# Patient Record
Sex: Female | Born: 1935 | Race: White | Hispanic: No | Smoking: Never smoker
Health system: Southern US, Community
[De-identification: ages and names within clinical notes are randomized; demographics above are authoritative.]

## PROBLEM LIST (undated history)

## (undated) DIAGNOSIS — F419 Anxiety disorder, unspecified: Secondary | ICD-10-CM

## (undated) DIAGNOSIS — N189 Chronic kidney disease, unspecified: Secondary | ICD-10-CM

## (undated) DIAGNOSIS — I4891 Unspecified atrial fibrillation: Secondary | ICD-10-CM

## (undated) DIAGNOSIS — F32A Depression, unspecified: Secondary | ICD-10-CM

## (undated) DIAGNOSIS — M797 Fibromyalgia: Secondary | ICD-10-CM

## (undated) DIAGNOSIS — G959 Disease of spinal cord, unspecified: Secondary | ICD-10-CM

## (undated) DIAGNOSIS — G43909 Migraine, unspecified, not intractable, without status migrainosus: Secondary | ICD-10-CM

## (undated) DIAGNOSIS — I82409 Acute embolism and thrombosis of unspecified deep veins of unspecified lower extremity: Secondary | ICD-10-CM

## (undated) DIAGNOSIS — F329 Major depressive disorder, single episode, unspecified: Secondary | ICD-10-CM

## (undated) HISTORY — DX: Anxiety disorder, unspecified: F41.9

## (undated) HISTORY — DX: Depression, unspecified: F32.A

## (undated) HISTORY — DX: Unspecified atrial fibrillation: I48.91

## (undated) HISTORY — DX: Fibromyalgia: M79.7

## (undated) HISTORY — DX: Disease of spinal cord, unspecified: G95.9

## (undated) HISTORY — DX: Acute embolism and thrombosis of unspecified deep veins of unspecified lower extremity: I82.409

## (undated) HISTORY — PX: ESOPHAGUS SURGERY: SHX626

## (undated) HISTORY — DX: Chronic kidney disease, unspecified: N18.9

## (undated) HISTORY — DX: Migraine, unspecified, not intractable, without status migrainosus: G43.909

---
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
## 2018-05-03 ENCOUNTER — Encounter (HOSPITAL_BASED_OUTPATIENT_CLINIC_OR_DEPARTMENT_OTHER): Payer: Medicare Other | Attending: Internal Medicine

## 2018-05-03 DIAGNOSIS — L97212 Non-pressure chronic ulcer of right calf with fat layer exposed: Secondary | ICD-10-CM | POA: Diagnosis not present

## 2018-05-03 DIAGNOSIS — L97822 Non-pressure chronic ulcer of other part of left lower leg with fat layer exposed: Secondary | ICD-10-CM | POA: Insufficient documentation

## 2018-05-03 DIAGNOSIS — I87333 Chronic venous hypertension (idiopathic) with ulcer and inflammation of bilateral lower extremity: Secondary | ICD-10-CM | POA: Diagnosis not present

## 2018-05-03 DIAGNOSIS — Z8673 Personal history of transient ischemic attack (TIA), and cerebral infarction without residual deficits: Secondary | ICD-10-CM | POA: Insufficient documentation

## 2018-05-03 DIAGNOSIS — L97812 Non-pressure chronic ulcer of other part of right lower leg with fat layer exposed: Secondary | ICD-10-CM | POA: Diagnosis not present

## 2018-05-03 DIAGNOSIS — L97222 Non-pressure chronic ulcer of left calf with fat layer exposed: Secondary | ICD-10-CM | POA: Diagnosis not present

## 2018-05-10 DIAGNOSIS — I87333 Chronic venous hypertension (idiopathic) with ulcer and inflammation of bilateral lower extremity: Secondary | ICD-10-CM | POA: Diagnosis not present

## 2018-05-18 DIAGNOSIS — I87333 Chronic venous hypertension (idiopathic) with ulcer and inflammation of bilateral lower extremity: Secondary | ICD-10-CM | POA: Diagnosis not present

## 2018-05-24 ENCOUNTER — Encounter (HOSPITAL_BASED_OUTPATIENT_CLINIC_OR_DEPARTMENT_OTHER): Payer: Medicare Other | Attending: Internal Medicine

## 2018-05-24 DIAGNOSIS — G629 Polyneuropathy, unspecified: Secondary | ICD-10-CM | POA: Diagnosis not present

## 2018-05-24 DIAGNOSIS — I87333 Chronic venous hypertension (idiopathic) with ulcer and inflammation of bilateral lower extremity: Secondary | ICD-10-CM | POA: Insufficient documentation

## 2018-05-24 DIAGNOSIS — L97211 Non-pressure chronic ulcer of right calf limited to breakdown of skin: Secondary | ICD-10-CM | POA: Insufficient documentation

## 2018-05-24 DIAGNOSIS — L97221 Non-pressure chronic ulcer of left calf limited to breakdown of skin: Secondary | ICD-10-CM | POA: Diagnosis not present

## 2018-05-25 ENCOUNTER — Ambulatory Visit: Payer: MEDICARE | Admitting: Podiatry

## 2018-05-31 ENCOUNTER — Encounter: Payer: Self-pay | Admitting: Podiatry

## 2018-05-31 ENCOUNTER — Ambulatory Visit (INDEPENDENT_AMBULATORY_CARE_PROVIDER_SITE_OTHER): Payer: Medicare Other | Admitting: Podiatry

## 2018-05-31 ENCOUNTER — Other Ambulatory Visit (HOSPITAL_COMMUNITY): Payer: Self-pay | Admitting: Internal Medicine

## 2018-05-31 DIAGNOSIS — M79675 Pain in left toe(s): Secondary | ICD-10-CM | POA: Diagnosis not present

## 2018-05-31 DIAGNOSIS — Z7901 Long term (current) use of anticoagulants: Secondary | ICD-10-CM

## 2018-05-31 DIAGNOSIS — L600 Ingrowing nail: Secondary | ICD-10-CM | POA: Diagnosis not present

## 2018-05-31 DIAGNOSIS — L97929 Non-pressure chronic ulcer of unspecified part of left lower leg with unspecified severity: Principal | ICD-10-CM

## 2018-05-31 DIAGNOSIS — L84 Corns and callosities: Secondary | ICD-10-CM | POA: Diagnosis not present

## 2018-05-31 DIAGNOSIS — B351 Tinea unguium: Secondary | ICD-10-CM

## 2018-05-31 DIAGNOSIS — L97919 Non-pressure chronic ulcer of unspecified part of right lower leg with unspecified severity: Secondary | ICD-10-CM

## 2018-05-31 DIAGNOSIS — I87333 Chronic venous hypertension (idiopathic) with ulcer and inflammation of bilateral lower extremity: Principal | ICD-10-CM

## 2018-05-31 DIAGNOSIS — M79674 Pain in right toe(s): Secondary | ICD-10-CM

## 2018-05-31 NOTE — Progress Notes (Signed)
Subjective:    Patient ID: Jordan Nicholson, female    DOB: [birth year], 85 y.o.   MRN: [id number]  HPI 85 year old female presents the office today for concerns of toenail pain.  She states that she gets ingrowing to both of her big toes but she denies any drainage or redness.  She has a is sore and tender at times.  She also has calluses on her left foot that should have trimmed.  She states that she currently goes to wound care once a week and she gets home care.  She has been in the boots previously put on.  She is on Eliquis   Review of Systems  All other systems reviewed and are negative.  History reviewed. No pertinent past medical history.  History reviewed. No pertinent surgical history.   Current Outpatient Medications:  .  ALPRAZolam (XANAX) 0.25 MG tablet, TK 1 T PO D PRN, Disp: , Rfl:  .  apixaban (ELIQUIS) 5 MG TABS tablet, TAKE 2 TABLETS TWICE A DAY FOR 7 DAYS THEN 1 TABLET TWICE DAILY THEREAFTER - REPLACES CLOPIDOGREL, Disp: , Rfl:  .  sucralfate (CARAFATE) 1 GM/10ML suspension, TAKE 2 TEASPOONSFULL BY MOUTH TWICE DAILY FOR ESPHEAGEAL SPASM, Disp: , Rfl:  .  topiramate (TOPAMAX) 25 MG tablet, TAKE 6 TABLETS BY MOUTH AT BEDTIME AS NEEDED FOR HEADACHE PREVENTION, Disp: , Rfl:  .  cephALEXin (KEFLEX) 500 MG capsule, TAKE 1 CAPSULE BY MOUTH FOUR TIMES A DAY FOR 7 DAYS, Disp: , Rfl: 0 .  DEXILANT 60 MG capsule, Take 1 capsule by mouth 2 (two) times daily., Disp: , Rfl: 3 .  dicyclomine (BENTYL) 20 MG tablet, Take by mouth., Disp: , Rfl:  .  estrogens, conjugated, (PREMARIN) 0.625 MG tablet, Take by mouth., Disp: , Rfl:  .  famotidine (PEPCID) 20 MG tablet, TAKE 1 TABLET BY MOUTH TWICE A DAY AS DIRECTED, Disp: , Rfl: 5 .  isometheptene-acetaminophen-dichloralphenazone (MIDRIN) 65-100-325 MG capsule, Take by mouth., Disp: , Rfl:  .  mometasone (ELOCON) 0.1 % cream, APPLY TO AFFECTED AREA TWICE A DAY FOR 14 DAYS, Disp: , Rfl: 0  Allergies  Allergen Reactions  . Codeine Nausea Only   . Lactose Diarrhea         Objective:   Physical Exam General: NAD- presents with son  Dermatological: Nails appear to be hypertrophic and dystrophic with yellow-brown discoloration and there is incurvation present on both corners of the right and left hallux.  There is no redness or drainage or any swelling to the toenail sites.  No open lesions are identified but there is callus formation on the left second and fourth.  Upon debridement there is no underlying ulceration drainage or any signs of infection.  Vascular: She has bandages to both feet still able to evaluate pulses but she has immediate capillary refill time to all the digits.  Neruologic: Sensation appears to be mildly decreased at the toes.  Musculoskeletal: No gross boney pedal deformities bilateral. No pain, crepitus, or limitation noted with foot and ankle range of motion bilateral. Muscular strength 5/5 in all groups tested bilateral.       Assessment & Plan:  85 year old female with symptomatic onychomycosis, ingrown toenails, callus formation. -Treatment options discussed including all alternatives, risks, and complications -Etiology of symptoms were discussed -Nails debrided 10 without complications or bleeding.  I debrided the ingrowing portion of ingrown toenails with any complications or bleeding. -Total lesions are debrided x2 without any complications or bleeding. -Daily foot inspection -Follow-up in  3 months or sooner if any problems arise. In the meantime, encouraged to call the office with any questions, concerns, change in symptoms.   Ovid Curd, DPM

## 2018-06-01 DIAGNOSIS — I87333 Chronic venous hypertension (idiopathic) with ulcer and inflammation of bilateral lower extremity: Secondary | ICD-10-CM | POA: Diagnosis not present

## 2018-06-02 ENCOUNTER — Ambulatory Visit (HOSPITAL_COMMUNITY)
Admission: RE | Admit: 2018-06-02 | Discharge: 2018-06-02 | Disposition: A | Discharge status: Home / Self Care | Payer: Medicare Other | Source: Ambulatory Visit | Attending: Vascular Surgery | Admitting: Vascular Surgery

## 2018-06-02 DIAGNOSIS — I87333 Chronic venous hypertension (idiopathic) with ulcer and inflammation of bilateral lower extremity: Secondary | ICD-10-CM

## 2018-06-02 DIAGNOSIS — L97919 Non-pressure chronic ulcer of unspecified part of right lower leg with unspecified severity: Secondary | ICD-10-CM

## 2018-06-02 DIAGNOSIS — L97929 Non-pressure chronic ulcer of unspecified part of left lower leg with unspecified severity: Secondary | ICD-10-CM | POA: Diagnosis present

## 2018-06-07 DIAGNOSIS — I87333 Chronic venous hypertension (idiopathic) with ulcer and inflammation of bilateral lower extremity: Secondary | ICD-10-CM | POA: Diagnosis not present

## 2018-06-14 DIAGNOSIS — I87333 Chronic venous hypertension (idiopathic) with ulcer and inflammation of bilateral lower extremity: Secondary | ICD-10-CM | POA: Diagnosis not present

## 2018-06-21 ENCOUNTER — Encounter (HOSPITAL_BASED_OUTPATIENT_CLINIC_OR_DEPARTMENT_OTHER): Payer: Medicare Other | Attending: Internal Medicine

## 2018-06-21 DIAGNOSIS — I890 Lymphedema, not elsewhere classified: Secondary | ICD-10-CM | POA: Insufficient documentation

## 2018-06-21 DIAGNOSIS — I87333 Chronic venous hypertension (idiopathic) with ulcer and inflammation of bilateral lower extremity: Secondary | ICD-10-CM | POA: Insufficient documentation

## 2018-06-21 DIAGNOSIS — L97212 Non-pressure chronic ulcer of right calf with fat layer exposed: Secondary | ICD-10-CM | POA: Insufficient documentation

## 2018-06-21 DIAGNOSIS — L97222 Non-pressure chronic ulcer of left calf with fat layer exposed: Secondary | ICD-10-CM | POA: Insufficient documentation

## 2018-06-25 ENCOUNTER — Encounter: Payer: 59 | Admitting: Vascular Surgery

## 2018-06-28 DIAGNOSIS — I87333 Chronic venous hypertension (idiopathic) with ulcer and inflammation of bilateral lower extremity: Secondary | ICD-10-CM | POA: Diagnosis not present

## 2018-07-05 DIAGNOSIS — I87333 Chronic venous hypertension (idiopathic) with ulcer and inflammation of bilateral lower extremity: Secondary | ICD-10-CM | POA: Diagnosis not present

## 2018-07-12 DIAGNOSIS — I87333 Chronic venous hypertension (idiopathic) with ulcer and inflammation of bilateral lower extremity: Secondary | ICD-10-CM | POA: Diagnosis not present

## 2018-07-19 ENCOUNTER — Encounter (HOSPITAL_BASED_OUTPATIENT_CLINIC_OR_DEPARTMENT_OTHER): Payer: Medicare Other | Attending: Internal Medicine

## 2018-07-19 DIAGNOSIS — I87332 Chronic venous hypertension (idiopathic) with ulcer and inflammation of left lower extremity: Secondary | ICD-10-CM | POA: Diagnosis present

## 2018-07-19 DIAGNOSIS — L97922 Non-pressure chronic ulcer of unspecified part of left lower leg with fat layer exposed: Secondary | ICD-10-CM | POA: Insufficient documentation

## 2018-07-19 DIAGNOSIS — G629 Polyneuropathy, unspecified: Secondary | ICD-10-CM | POA: Diagnosis not present

## 2018-07-27 DIAGNOSIS — I87332 Chronic venous hypertension (idiopathic) with ulcer and inflammation of left lower extremity: Secondary | ICD-10-CM | POA: Diagnosis not present

## 2018-08-02 ENCOUNTER — Other Ambulatory Visit: Payer: Self-pay

## 2018-08-02 ENCOUNTER — Encounter: Payer: Self-pay | Admitting: Podiatry

## 2018-08-02 ENCOUNTER — Ambulatory Visit (INDEPENDENT_AMBULATORY_CARE_PROVIDER_SITE_OTHER): Payer: Medicare Other | Admitting: Podiatry

## 2018-08-02 ENCOUNTER — Encounter: Payer: 59 | Admitting: Surgery

## 2018-08-02 DIAGNOSIS — Z9229 Personal history of other drug therapy: Secondary | ICD-10-CM

## 2018-08-02 DIAGNOSIS — L84 Corns and callosities: Secondary | ICD-10-CM

## 2018-08-02 DIAGNOSIS — B351 Tinea unguium: Secondary | ICD-10-CM | POA: Diagnosis not present

## 2018-08-02 DIAGNOSIS — M79675 Pain in left toe(s): Secondary | ICD-10-CM

## 2018-08-02 DIAGNOSIS — M79674 Pain in right toe(s): Secondary | ICD-10-CM

## 2018-08-02 NOTE — Patient Instructions (Signed)
Corns and Calluses Corns are small areas of thickened skin that occur on the top, sides, or tip of a toe. They contain a cone-shaped core with a point that can press on a nerve below. This causes pain.  Calluses are areas of thickened skin that can occur anywhere on the body, including the hands, fingers, palms, soles of the feet, and heels. Calluses are usually larger than corns. What are the causes? Corns and calluses are caused by rubbing (friction) or pressure, such as from shoes that are too tight or do not fit properly. What increases the risk? Corns are more likely to develop in people who have misshapen toes (toe deformities), such as hammer toes. Calluses can occur with friction to any area of the skin. They are more likely to develop in people who:  Work with their hands.  Wear shoes that fit poorly, are too tight, or are high-heeled.  Have toe deformities. What are the signs or symptoms? Symptoms of a corn or callus include:  A hard growth on the skin.  Pain or tenderness under the skin.  Redness and swelling.  Increased discomfort while wearing tight-fitting shoes, if your feet are affected. If a corn or callus becomes infected, symptoms may include:  Redness and swelling that gets worse.  Pain.  Fluid, blood, or pus draining from the corn or callus. How is this diagnosed? Corns and calluses may be diagnosed based on your symptoms, your medical history, and a physical exam. How is this treated? Treatment for corns and calluses may include:  Removing the cause of the friction or pressure. This may involve: ? Changing your shoes. ? Wearing shoe inserts (orthotics) or other protective layers in your shoes, such as a corn pad. ? Wearing gloves.  Applying medicine to the skin (topical medicine) to help soften skin in the hardened, thickened areas.  Removing layers of dead skin with a file to reduce the size of the corn or callus.  Removing the corn or callus with a  scalpel or laser.  Taking antibiotic medicines, if your corn or callus is infected.  Having surgery, if a toe deformity is the cause. Follow these instructions at home:   Take over-the-counter and prescription medicines only as told by your health care provider.  If you were prescribed an antibiotic, take it as told by your health care provider. Do not stop taking it even if your condition starts to improve.  Wear shoes that fit well. Avoid wearing high-heeled shoes and shoes that are too tight or too loose.  Wear any padding, protective layers, gloves, or orthotics as told by your health care provider.  Soak your hands or feet and then use a file or pumice stone to soften your corn or callus. Do this as told by your health care provider.  Check your corn or callus every day for symptoms of infection. Contact a health care provider if you:  Notice that your symptoms do not improve with treatment.  Have redness or swelling that gets worse.  Notice that your corn or callus becomes painful.  Have fluid, blood, or pus coming from your corn or callus.  Have new symptoms. Summary  Corns are small areas of thickened skin that occur on the top, sides, or tip of a toe.  Calluses are areas of thickened skin that can occur anywhere on the body, including the hands, fingers, palms, and soles of the feet. Calluses are usually larger than corns.  Corns and calluses are caused by   rubbing (friction) or pressure, such as from shoes that are too tight or do not fit properly.  Treatment may include wearing any padding, protective layers, gloves, or orthotics as told by your health care provider. This information is not intended to replace advice given to you by your health care provider. Make sure you discuss any questions you have with your health care provider. Document Released: 02/09/2004 Document Revised: 03/18/2017 Document Reviewed: 03/18/2017 Elsevier Interactive Patient Education   2019 [employer]  Onychomycosis/Fungal Toenails  WHAT IS IT? An infection that lies within the keratin of your nail plate that is caused by a fungus.  WHY ME? Fungal infections affect all ages, sexes, races, and creeds.  There may be many factors that predispose you to a fungal infection such as age, coexisting medical conditions such as diabetes, or an autoimmune disease; stress, medications, fatigue, genetics, etc.  Bottom line: fungus thrives in a warm, moist environment and your shoes offer such a location.  IS IT CONTAGIOUS? Theoretically, yes.  You do not want to share shoes, nail clippers or files with someone who has fungal toenails.  Walking around barefoot in the same room or sleeping in the same bed is unlikely to transfer the organism.  It is important to realize, however, that fungus can spread easily from one nail to the next on the same foot.  HOW DO WE TREAT THIS?  There are several ways to treat this condition.  Treatment may depend on many factors such as age, medications, pregnancy, liver and kidney conditions, etc.  It is best to ask your doctor which options are available to you.  1. No treatment.   Unlike many other medical concerns, you can live with this condition.  However for many people this can be a painful condition and may lead to ingrown toenails or a bacterial infection.  It is recommended that you keep the nails cut short to help reduce the amount of fungal nail. 2. Topical treatment.  These range from herbal remedies to prescription strength nail lacquers.  About 40-50% effective, topicals require twice daily application for approximately 9 to 12 months or until an entirely new nail has grown out.  The most effective topicals are medical grade medications available through physicians offices. 3. Oral antifungal medications.  With an 80-90% cure rate, the most common oral medication requires 3 to 4 months of therapy and stays in your system for a year as the new nail  grows out.  Oral antifungal medications do require blood work to make sure it is a safe drug for you.  A liver function panel will be performed prior to starting the medication and after the first month of treatment.  It is important to have the blood work performed to avoid any harmful side effects.  In general, this medication safe but blood work is required. 4. Laser Therapy.  This treatment is performed by applying a specialized laser to the affected nail plate.  This therapy is noninvasive, fast, and non-painful.  It is not covered by insurance and is therefore, out of pocket.  The results have been very good with a 80-95% cure rate.  [facility] is the only practice in the area to offer this therapy. 5. Permanent Nail Avulsion.  Removing the entire nail so that a new nail will not grow back. 

## 2018-08-03 DIAGNOSIS — I87332 Chronic venous hypertension (idiopathic) with ulcer and inflammation of left lower extremity: Secondary | ICD-10-CM | POA: Diagnosis not present

## 2018-08-09 ENCOUNTER — Encounter: Payer: 59 | Admitting: Surgery

## 2018-08-11 NOTE — Progress Notes (Signed)
Subjective: Jordan Nicholson is a 85 y.o. y.o. female who is on long term blood thinner Eliquis and presents today with painful, discolored, thick toenails which interfere with daily activities. Pain is aggravated when wearing enclosed shoe gear. Pain is relieved with periodic professional debridement.  Pt also presents with painful corn left 2nd digit.  Patient, No Pcp Per    Current Outpatient Medications:  .  escitalopram (LEXAPRO) 5 MG tablet, Take one tab PO qhs., Disp: , Rfl:  .  hydrochlorothiazide (HYDRODIURIL) 25 MG tablet, TAKE 1 TABLET(25 MG) BY MOUTH DAILY, Disp: , Rfl:  .  lidocaine (LIDODERM) 5 %, APPLY 1 PATCH D, Disp: , Rfl:  .  ALPRAZolam (XANAX) 0.25 MG tablet, TK 1 T PO D PRN, Disp: , Rfl:  .  apixaban (ELIQUIS) 5 MG TABS tablet, TAKE 2 TABLETS TWICE A DAY FOR 7 DAYS THEN 1 TABLET TWICE DAILY THEREAFTER - REPLACES CLOPIDOGREL, Disp: , Rfl:  .  cephALEXin (KEFLEX) 500 MG capsule, TAKE 1 CAPSULE BY MOUTH FOUR TIMES A DAY FOR 7 DAYS, Disp: , Rfl: 0 .  DEXILANT 60 MG capsule, Take 1 capsule by mouth 2 (two) times daily., Disp: , Rfl: 3 .  dicyclomine (BENTYL) 20 MG tablet, Take by mouth., Disp: , Rfl:  .  estrogens, conjugated, (PREMARIN) 0.625 MG tablet, Take by mouth., Disp: , Rfl:  .  famotidine (PEPCID) 20 MG tablet, TAKE 1 TABLET BY MOUTH TWICE A DAY AS DIRECTED, Disp: , Rfl: 5 .  isometheptene-acetaminophen-dichloralphenazone (MIDRIN) 65-100-325 MG capsule, Take by mouth., Disp: , Rfl:  .  mometasone (ELOCON) 0.1 % cream, APPLY TO AFFECTED AREA TWICE A DAY FOR 14 DAYS, Disp: , Rfl: 0 .  ranitidine (ZANTAC) 150 MG tablet, TAKE 1 TABLET BY MOUTH TWICE A DAY ONE HOUR BEFORE MEALS, Disp: , Rfl: 0 .  sucralfate (CARAFATE) 1 GM/10ML suspension, TAKE 2 TEASPOONSFULL BY MOUTH TWICE DAILY FOR ESPHEAGEAL SPASM, Disp: , Rfl:  .  topiramate (TOPAMAX) 25 MG tablet, TAKE 6 TABLETS BY MOUTH AT BEDTIME AS NEEDED FOR HEADACHE PREVENTION, Disp: , Rfl:  .  traMADol (ULTRAM) 50 MG tablet, Take by  mouth., Disp: , Rfl:    Allergies  Allergen Reactions  . Codeine Nausea Only  . Lactose Diarrhea     Objective: Vascular Examination: Capillary refill time immediate x 10 digits.  Dorsalis pedis pulses palpable b/l.  Posterior tibial pulses palpable b/l.  No digital hair x 10 digits.  Skin temperature gradient WNL b/l.  Dermatological Examination: Skin warm and dry b/l.  Toenails 1-5 b/l discolored, thick, dystrophic with subungual debris and pain with palpation to nailbeds due to thickness of nails.  Hyperkeratotic lesion left 2nd digit. No erythema, no edema, no drainage, no flocculence noted.   Porokeratotic lesions submet with tenderness to palpation. No erythema, no edema, no drainage, no flocculence.   Musculoskeletal: Muscle strength 5/5 to all LE muscle groups  Neurological: Sensation decreased with 10 gram monofilament.  Assessment: Painful onychomycosis toenails 1-5 b/l in patient on blood thinner.  Corn left 2nd digit  Plan: 1. Toenails 1-5 b/l were debrided in length and girth without iatrogenic bleeding. 2. Hyperkeratotic lesion(s) left 2nd digit debrided utilizing sterile scalpel blade without incident.  3. Patient to continue soft, supportive shoe gear daily. 4. Patient to report any pedal injuries to medical professional immediately. 5. Avoid self trimming due to use of blood thinner. 6. Follow up 3 months. 7. Patient/POA to call should there be a concern in the interim.

## 2018-08-19 ENCOUNTER — Other Ambulatory Visit: Payer: Self-pay

## 2018-08-19 ENCOUNTER — Encounter (HOSPITAL_BASED_OUTPATIENT_CLINIC_OR_DEPARTMENT_OTHER): Payer: Medicare Other | Attending: Internal Medicine

## 2018-08-19 DIAGNOSIS — L97222 Non-pressure chronic ulcer of left calf with fat layer exposed: Secondary | ICD-10-CM | POA: Diagnosis not present

## 2018-08-19 DIAGNOSIS — L03115 Cellulitis of right lower limb: Secondary | ICD-10-CM | POA: Insufficient documentation

## 2018-08-19 DIAGNOSIS — I87332 Chronic venous hypertension (idiopathic) with ulcer and inflammation of left lower extremity: Secondary | ICD-10-CM | POA: Diagnosis present

## 2018-08-19 DIAGNOSIS — L03116 Cellulitis of left lower limb: Secondary | ICD-10-CM | POA: Diagnosis not present

## 2018-09-02 DIAGNOSIS — I87332 Chronic venous hypertension (idiopathic) with ulcer and inflammation of left lower extremity: Secondary | ICD-10-CM | POA: Diagnosis not present

## 2018-09-06 DIAGNOSIS — I87332 Chronic venous hypertension (idiopathic) with ulcer and inflammation of left lower extremity: Secondary | ICD-10-CM | POA: Diagnosis not present

## 2018-10-04 ENCOUNTER — Ambulatory Visit: Payer: Medicare Other | Admitting: Podiatry

## 2018-10-18 ENCOUNTER — Encounter: Payer: Self-pay | Admitting: Neurology

## 2018-10-18 ENCOUNTER — Ambulatory Visit (INDEPENDENT_AMBULATORY_CARE_PROVIDER_SITE_OTHER): Payer: Medicare Other | Admitting: Neurology

## 2018-10-18 ENCOUNTER — Telehealth: Payer: Self-pay | Admitting: Neurology

## 2018-10-18 ENCOUNTER — Other Ambulatory Visit: Payer: Self-pay

## 2018-10-18 VITALS — BP 118/70 | HR 58 | Temp 98.1°F | Ht 61.0 in | Wt 99.0 lb

## 2018-10-18 DIAGNOSIS — E538 Deficiency of other specified B group vitamins: Secondary | ICD-10-CM

## 2018-10-18 DIAGNOSIS — R413 Other amnesia: Secondary | ICD-10-CM | POA: Diagnosis not present

## 2018-10-18 DIAGNOSIS — R269 Unspecified abnormalities of gait and mobility: Secondary | ICD-10-CM

## 2018-10-18 MED ORDER — MEMANTINE HCL 5 MG PO TABS: ORAL_TABLET | ORAL | 0 refills | Status: AC

## 2018-10-18 NOTE — Progress Notes (Signed)
Reason for visit: Memory disturbance, cervical myelopathy  Referring physician: Dr. Jinny Blossom is a 85 y.o. female  History of present illness:  Jordan Nicholson is an 85 year old right-handed white female with a history of cervical spondylosis associated with a cervical myelopathy.  The patient has recently moved from [place], [place] to this area to live with her daughter.  The patient had been living alone there for about 10 years after her husband died.  She had been followed through a neurologist in [place], [place].  She had MRI of the cervical spine showing severe spinal stenosis at the C2-3 level with increased cord signal consistent with a myelopathy.  The patient has had some ongoing progression of her problems with walking, she has not had any recent falls, she walks with a cane.  She has noted some recent increased problems with urinary urgency and incontinence.  She has been seen by 2 neurosurgeons who indicated that surgery was not recommended.  The patient has also had some troubles with memory, it is not clear how long this has been an issue.  The patient has a history of fibromyalgia and irritable bowel syndrome and migraine headache.  She has been on Topamax for quite a number of years, she currently takes 150 mg at night.  She takes Bentyl occasionally if needed for her irritable bowel.  She reports that she has some trouble with remembering names for people, she may misplace things about the house.  Prior to her move to this area, she was able to manage her medications and appointments, she operated a motor vehicle without difficulty, she was able to do the finances.  She currently indicates that she does have some neck and shoulder discomfort, she may have some tingles or shock sensations down the arms at times.  She reports some occasional episodes of dizziness.  Her migraine headaches occur frequently, almost daily but are quite brief lasting only a few minutes  or up to a half an hour.  The Topamax did help these headaches.  She has had these headaches throughout her entire life.  She indicates that her father had memory problems when he was 32 years old.  Past Medical History:  Diagnosis Date   Anxiety    Atrial fibrillation (HCC)    Cervical myelopathy (HCC)    Chronic renal insufficiency    Depression    DVT (deep venous thrombosis) (HCC)    Left lower extremity   Fibromyalgia    Migraine    Myelopathy (HCC)     Past Surgical History:  Procedure Laterality Date   ESOPHAGUS SURGERY      Family History  Problem Relation Age of Onset   Non-Hodgkin's lymphoma Mother    Cancer - Lung Sister    Heart disease Sister     Social history:  reports that she has never smoked. She has never used smokeless tobacco. She reports that she does not drink alcohol or use drugs.  Medications:  Prior to Admission medications   Medication Sig Start Date End Date Taking? Authorizing Provider  Alpha Lipoic Acid 200 MG CAPS Take by mouth.   Yes [provider]  ALPRAZolam (XANAX) 0.25 MG tablet TK 1 T PO D PRN 10/26/14  Yes [provider]  apixaban (ELIQUIS) 5 MG TABS tablet TAKE 2 TABLETS TWICE A DAY FOR 7 DAYS THEN 1 TABLET TWICE DAILY THEREAFTER - REPLACES CLOPIDOGREL 11/27/14  Yes [provider]  Cholecalciferol (VITAMIN D3 PO)  Take by mouth. 2000 units   Yes [provider]  co-enzyme Q-10 30 MG capsule Take 30 mg by mouth 3 (three) times daily.   Yes [provider]  Cyanocobalamin (VITAMIN B 12 PO) Take by mouth.   Yes [provider]  DEXILANT 60 MG capsule Take 1 capsule by mouth 2 (two) times daily. 03/08/18  Yes [provider]  dicyclomine (BENTYL) 20 MG tablet Take by mouth.   Yes [provider]  Docosahexaenoic Acid (DHA COMPLETE PO) Take by mouth.   Yes [provider]  estrogens, conjugated, (PREMARIN) 0.625 MG tablet Take by mouth.   Yes  [provider]  isometheptene-acetaminophen-dichloralphenazone (MIDRIN) 65-100-325 MG capsule Take by mouth.   Yes [provider]  Probiotic Product (PROBIOTIC-10 PO) Take by mouth.   Yes [provider]  sucralfate (CARAFATE) 1 GM/10ML suspension TAKE 2 TEASPOONSFULL BY MOUTH TWICE DAILY FOR ESPHEAGEAL SPASM 10/30/14  Yes [provider]  topiramate (TOPAMAX) 25 MG tablet TAKE 6 TABLETS BY MOUTH AT BEDTIME AS NEEDED FOR HEADACHE PREVENTION 03/17/16  Yes [provider]  cephALEXin (KEFLEX) 500 MG capsule TAKE 1 CAPSULE BY MOUTH FOUR TIMES A DAY FOR 7 DAYS 03/16/18   [provider]  escitalopram (LEXAPRO) 5 MG tablet Take one tab PO qhs. 10/23/15   [provider]  famotidine (PEPCID) 20 MG tablet TAKE 1 TABLET BY MOUTH TWICE A DAY AS DIRECTED 03/24/18   [provider]  hydrochlorothiazide (HYDRODIURIL) 25 MG tablet TAKE 1 TABLET(25 MG) BY MOUTH DAILY 02/16/17   [provider]  lidocaine (LIDODERM) 5 % APPLY 1 PATCH D 01/11/15   [provider]  mometasone (ELOCON) 0.1 % cream APPLY TO AFFECTED AREA TWICE A DAY FOR 14 DAYS 04/06/18   [provider]  ranitidine (ZANTAC) 150 MG tablet TAKE 1 TABLET BY MOUTH TWICE A DAY ONE HOUR BEFORE MEALS 02/15/18   [provider]  traMADol (ULTRAM) 50 MG tablet Take by mouth.    [provider]      Allergies  Allergen Reactions   Codeine Nausea Only   Lactose Diarrhea    ROS:  Out of a complete 14 system review of symptoms, the patient complains only of the following symptoms, and all other reviewed systems are negative.  Fatigue Heart murmur, swelling in the legs Hearing loss, difficulty swallowing Skin rash, itching Double vision Constipation Easy bruising Feeling cold, increased thirst Joint pain, muscle cramps Allergies, runny nose, skin sensitivity Headaches, numbness, difficulty swallowing, dizziness Depression, anxiety,  decreased energy, change in appetite Insomnia, sleepiness  Blood pressure 118/70, pulse (!) 58, temperature 98.1 F (36.7 C), temperature source Oral, height  (1.549 m), weight 99 lb (44.9 kg).  Physical Exam  General: The patient is alert and cooperative at the time of the examination.  Eyes: Pupils are equal, round, and reactive to light. Discs are flat bilaterally.  Neck: The neck is supple, no carotid bruits are noted.  Respiratory: The respiratory examination is clear.  Cardiovascular: The cardiovascular examination reveals an irregularly irregular heart rhythm, no obvious murmurs or rubs are noted.  Skin: Extremities are with 1-2+ edema below the knees.  Neurologic Exam  Mental status: The patient is alert and oriented x 3 at the time of the examination. The Mini-Mental Status Examination done today shows total score 23/30.  Cranial nerves: Facial symmetry is present. There is good sensation of the face to pinprick and soft touch bilaterally. The strength of the facial muscles  and the muscles to head turning and shoulder shrug are normal bilaterally. Speech is well enunciated, no aphasia or dysarthria is noted. Extraocular movements are full. Visual fields are full. The tongue is midline, and the patient has symmetric elevation of the soft palate. No obvious hearing deficits are noted.  Motor: The motor testing reveals 5 over 5 strength of all 4 extremities. Good symmetric motor tone is noted throughout.  Sensory: Sensory testing is intact to pinprick, soft touch, vibration sensation, and position sense on all 4 extremities, with exception of some slight decrease in position sense in both feet. No evidence of extinction is noted.  Coordination: Cerebellar testing reveals good finger-nose-finger and heel-to-shin bilaterally.  Gait and station: Gait is slightly wide-based but the patient can walk independently, she usually uses a cane for ambulation.  Tandem gait is slightly  unsteady.  Romberg is negative.  Reflexes: Deep tendon reflexes are symmetric, but are slightly brisk bilaterally. Toes are downgoing bilaterally.   Assessment/Plan:  1.  Cervical myelopathy  2.  Gait disorder  3.  Intractable migraine headache  4.  Memory disturbance  5.  Fibromyalgia  6.  Cerebrovascular disease  7.  Atrial fibrillation  The patient is on a relatively high dose of Topamax which can impair cognitive functioning but lower doses apparently in the past have resulted in an increase in her headache.  The patient may be candidate for 1 of the newer medications such as Aimovig or Ajovy in the future and possibly allow us to reduce the Topamax dose.  The patient has been on gabapentin in the past but could not tolerate it, so believes that she has been on Cymbalta previously.  The patient will be set up for CT scan of the brain, the memory issue will be followed, she will start Namenda.  The patient will be sent for physical therapy for gait training although her balance is not extremely bad.  The cervical myelopathy issue is not treatable given her age.  She will follow-up here in 4 months.    Marlan Palau. Keith Eilene Voigt MD 10/18/2018 2:29 PM  [facility] [address] [place] [id number]  Phone [phone] Fax [phone]

## 2018-10-18 NOTE — Patient Instructions (Signed)
We will start Namenda for the memory. Call for a regular prescription in one month if tolerated.

## 2018-10-18 NOTE — Telephone Encounter (Signed)
Medicare/CIgna order sent to GI. No auth for Medicare they obtain the auth for Rosann Auerbach they will reach out to the patient to schedule.

## 2018-10-19 ENCOUNTER — Telehealth: Payer: Self-pay

## 2018-10-19 LAB — VITAMIN B12: Vitamin B-12: 690 pg/mL (ref 232–1245)

## 2018-10-19 LAB — SYPHILIS: RPR W/REFLEX TO RPR TITER AND TREPONEMAL ANTIBODIES, TRADITIONAL SCREENING AND DIAGNOSIS ALGORITHM: RPR Ser Ql: NONREACTIVE

## 2018-10-19 NOTE — Telephone Encounter (Signed)
-----   Message from York Spaniel, MD sent at 10/19/2018 10:29 AM EDT -----  The blood work results are unremarkable. Please call the patient. ----- Message ----- From: [facility] Results In Sent: 10/19/2018   7:37 AM EDT To: York Spaniel, MD

## 2018-10-19 NOTE — Telephone Encounter (Signed)
I contacted the pt and advised of blood test results. Pt verbalized understanding and had no questions at this time.

## 2018-10-20 ENCOUNTER — Ambulatory Visit (INDEPENDENT_AMBULATORY_CARE_PROVIDER_SITE_OTHER): Payer: Medicare Other | Admitting: Podiatry

## 2018-10-20 ENCOUNTER — Encounter: Payer: Self-pay | Admitting: Podiatry

## 2018-10-20 ENCOUNTER — Other Ambulatory Visit: Payer: Self-pay

## 2018-10-20 VITALS — Temp 97.7°F

## 2018-10-20 DIAGNOSIS — L84 Corns and callosities: Secondary | ICD-10-CM | POA: Diagnosis not present

## 2018-10-20 DIAGNOSIS — G629 Polyneuropathy, unspecified: Secondary | ICD-10-CM | POA: Diagnosis not present

## 2018-10-20 DIAGNOSIS — M79674 Pain in right toe(s): Secondary | ICD-10-CM

## 2018-10-20 DIAGNOSIS — B351 Tinea unguium: Secondary | ICD-10-CM | POA: Diagnosis not present

## 2018-10-20 DIAGNOSIS — M79675 Pain in left toe(s): Secondary | ICD-10-CM

## 2018-10-20 DIAGNOSIS — Z9229 Personal history of other drug therapy: Secondary | ICD-10-CM

## 2018-10-20 NOTE — Patient Instructions (Signed)

## 2018-10-24 NOTE — Progress Notes (Signed)
Subjective: Jordan Nicholson is a 85 y.o. y.o. female who is on long term blood thinner, Eliquis,  and presents today with painful, discolored, thick toenails which interfere with daily activities. Pain is aggravated when wearing enclosed shoe gear. Pain is relieved with periodic professional debridement.  Pt also presents with painful corn formation left 2nd digit. Pain is aggravated when wearing enclosed shoe gear as well.  Irena Reichmannollins, Dana, DO  Is her PCP.    Current Outpatient Medications:  .  Alpha Lipoic Acid 200 MG CAPS, Take by mouth., Disp: , Rfl:  .  ALPRAZolam (XANAX) 0.25 MG tablet, TK 1 T PO D PRN, Disp: , Rfl:  .  apixaban (ELIQUIS) 5 MG TABS tablet, TAKE 2 TABLETS TWICE A DAY FOR 7 DAYS THEN 1 TABLET TWICE DAILY THEREAFTER - REPLACES CLOPIDOGREL, Disp: , Rfl:  .  cephALEXin (KEFLEX) 500 MG capsule, TAKE 1 CAPSULE BY MOUTH FOUR TIMES A DAY FOR 7 DAYS, Disp: , Rfl: 0 .  Cholecalciferol (VITAMIN D3 PO), Take by mouth. 2000 units, Disp: , Rfl:  .  co-enzyme Q-10 30 MG capsule, Take 30 mg by mouth 3 (three) times daily., Disp: , Rfl:  .  Cyanocobalamin (VITAMIN B 12 PO), Take by mouth., Disp: , Rfl:  .  DEXILANT 60 MG capsule, Take 1 capsule by mouth 2 (two) times daily., Disp: , Rfl: 3 .  dicyclomine (BENTYL) 20 MG tablet, Take by mouth., Disp: , Rfl:  .  Docosahexaenoic Acid (DHA COMPLETE PO), Take by mouth., Disp: , Rfl:  .  escitalopram (LEXAPRO) 5 MG tablet, Take one tab PO qhs., Disp: , Rfl:  .  estrogens, conjugated, (PREMARIN) 0.625 MG tablet, Take by mouth., Disp: , Rfl:  .  famotidine (PEPCID) 20 MG tablet, TAKE 1 TABLET BY MOUTH TWICE A DAY AS DIRECTED, Disp: , Rfl: 5 .  hydrochlorothiazide (HYDRODIURIL) 25 MG tablet, TAKE 1 TABLET(25 MG) BY MOUTH DAILY, Disp: , Rfl:  .  isometheptene-acetaminophen-dichloralphenazone (MIDRIN) 65-100-325 MG capsule, Take by mouth., Disp: , Rfl:  .  lidocaine (LIDODERM) 5 %, APPLY 1 PATCH D, Disp: , Rfl:  .  memantine (NAMENDA) 5 MG tablet, Take 1  tablet daily for one week, then take 1 tablet twice daily for one week, then take 1 tablet in the morning and 2 in the evening for one week, then take 2 tablets twice daily, Disp: 70 tablet, Rfl: 0 .  mometasone (ELOCON) 0.1 % cream, APPLY TO AFFECTED AREA TWICE A DAY FOR 14 DAYS, Disp: , Rfl: 0 .  Probiotic Product (PROBIOTIC-10 PO), Take by mouth., Disp: , Rfl:  .  ranitidine (ZANTAC) 150 MG tablet, TAKE 1 TABLET BY MOUTH TWICE A DAY ONE HOUR BEFORE MEALS, Disp: , Rfl: 0 .  sucralfate (CARAFATE) 1 GM/10ML suspension, TAKE 2 TEASPOONSFULL BY MOUTH TWICE DAILY FOR ESPHEAGEAL SPASM, Disp: , Rfl:  .  topiramate (TOPAMAX) 25 MG tablet, TAKE 6 TABLETS BY MOUTH AT BEDTIME AS NEEDED FOR HEADACHE PREVENTION, Disp: , Rfl:  .  traMADol (ULTRAM) 50 MG tablet, Take by mouth., Disp: , Rfl:    Allergies  Allergen Reactions  . Codeine Nausea Only  . Lactose Diarrhea     Objective: Vitals:   10/20/18 1617  Temp: 97.7 F (36.5 C)    Vascular Examination: Capillary refill time immediate x 10 digits.  Dorsalis pedis pulses palpable b/l.  Posterior tibial pulses palpable b/l.  No digital hair x 10 digits.  Skin temperature gradient WNL b/l.   Dermatological Examination: Skin with  normal turgor, texture and tone b/l.  Toenails 1-5 b/l discolored, thick, dystrophic with subungual debris and pain with palpation to nailbeds due to thickness of nails.  Hyperkeratotic lesion dorsal right 2nd digit PIPJ. No erythema, no edema, no drainage, no flocculence noted.  Musculoskeletal: Muscle strength 5/5 to all LE muscle groups.  Hammertoe left 2nd digit.  Neurological: Sensation decreased with 10 gram monofilament.  Vibratory sensation intact.  Assessment: 1. Painful onychomycosis toenails 1-5 b/l in patient on blood thinner 2. Corn left 2nd digit 3. Neuropathy  Plan: 1. Toenails 1-5 b/l were debrided in length and girth without iatrogenic bleeding. 2. Hyperkeratotic lesion(s) left 2nd digit  debrided utilizing sterile scalpel blade without incident. Dispensed silicone digital toe cap for daily protection and comfort.  3. Patient to continue soft, supportive shoe gear daily. 4. Patient to report any pedal injuries to medical professional immediately. 5. Avoid self trimming due to use of blood thinner. 6. Follow up 10 weeks. 7. Patient/POA to call should there be a concern in the interim.

## 2018-11-03 ENCOUNTER — Ambulatory Visit
Admission: RE | Admit: 2018-11-03 | Discharge: 2018-11-03 | Disposition: A | Discharge status: Home / Self Care | Payer: Medicare Other | Source: Ambulatory Visit | Attending: Neurology | Admitting: Neurology

## 2018-11-03 DIAGNOSIS — R413 Other amnesia: Secondary | ICD-10-CM | POA: Diagnosis not present

## 2018-11-04 ENCOUNTER — Telehealth: Payer: Self-pay | Admitting: Neurology

## 2018-11-04 NOTE — Telephone Encounter (Signed)
I called pt. She reports dizziness since starting namenda. She is concerned about her fall risk with dizziness. She stopped taking namenda a week ago. Since she stopped namenda, her dizziness has improved. She is not interested in starting a new medication for her memory at this time.  This is just an FYI to Dr. Jannifer Franklin, per pt.

## 2018-11-04 NOTE — Telephone Encounter (Signed)
Pt states she is not tolerating her memantine (NAMENDA) 5 MG tablet very well and she would like RN or Provider to call her back when back in office.

## 2018-11-05 ENCOUNTER — Telehealth: Payer: Self-pay | Admitting: Neurology

## 2018-11-05 NOTE — Telephone Encounter (Signed)
I called the patient.  CT scan of the brain appears to be unremarkable, there is some mild atrophy seen.  No significant cerebrovascular disease is noted.  I discussed this with the patient.    CT brain 11/03/18:  IMPRESSION:   CT head (without) demonstrating: - Mild perisylvian and mesial temporal atrophy.  - No acute findings.

## 2018-11-07 ENCOUNTER — Other Ambulatory Visit: Payer: Self-pay | Admitting: Neurology

## 2018-11-08 ENCOUNTER — Encounter: Payer: Self-pay | Admitting: Surgery

## 2018-11-08 ENCOUNTER — Ambulatory Visit (INDEPENDENT_AMBULATORY_CARE_PROVIDER_SITE_OTHER): Payer: Medicare Other | Admitting: Surgery

## 2018-11-08 DIAGNOSIS — I872 Venous insufficiency (chronic) (peripheral): Secondary | ICD-10-CM | POA: Diagnosis not present

## 2018-11-08 DIAGNOSIS — M7989 Other specified soft tissue disorders: Secondary | ICD-10-CM | POA: Insufficient documentation

## 2018-11-08 NOTE — Telephone Encounter (Signed)
I called the daughter.  The patient cannot tolerate Namenda secondary to dizziness.  She does not wish to go on a medication for memory currently.  We may try slowly tapering the Topamax, she is on 150 mg at night, we will try reducing this by 25 mg every week, if the headaches significantly worsen, we may have to see if we can get her on another medication such as Aimovig.  The CT of the brain was relatively unremarkable.

## 2018-11-08 NOTE — Progress Notes (Signed)
[facility]  Patient name: Jordan Nicholson MRN: [id number] DOB: 06-06-1926 Sex: female   REQUESTING PROVIDER:    Dr. Leanord Hawkingobson   REASON FOR CONSULT:    Venous disease  HISTORY OF PRESENT ILLNESS:   Jordan AlarJayne Bondarenko is a 85 y.o. female, who is referred for further evaluation of leg swelling.  The patient has had trouble with leg swelling for years.  She has had multiple ulcers, fortunately which have all healed.  Her first ulcer took 2 years to heal the second took 1 year to heal.  She has also had issues with infection.  She can not put on compression stickings due to her arthritis.  She tries to keep her legs elevated however this is difficult for her.  She does receive some benefit with regards to the swelling with leg elevation.  The patient is on Eliquis for atrial fibrillation.  She suffers from chronic renal insufficiency.  She also has a history of DVT.  PAST MEDICAL HISTORY    Past Medical History:  Diagnosis Date  . Anxiety   . Atrial fibrillation (HCC)   . Cervical myelopathy (HCC)   . Chronic renal insufficiency   . Depression   . DVT (deep venous thrombosis) (HCC)    Left lower extremity  . Fibromyalgia   . Migraine   . Myelopathy (HCC)      FAMILY HISTORY   Family History  Problem Relation Age of Onset  . Non-Hodgkin's lymphoma Mother   . Dementia Father   . Cancer - Lung Sister   . Heart disease Sister     SOCIAL HISTORY:   Social History   Socioeconomic History  . Marital status: Widowed    Spouse name: Not on file  . Number of children: Not on file  . Years of education: Not on file  . Highest education level: Not on file  Occupational History  . Not on file  Social Needs  . Financial resource strain: Not on file  . Food insecurity    Worry: Not on file    Inability: Not on file  . Transportation needs    Medical: Not on file    Non-medical: Not on file  Tobacco Use  . Smoking  status: Never Smoker  . Smokeless tobacco: Never Used  Substance and Sexual Activity  . Alcohol use: Never    Frequency: Never  . Drug use: Never  . Sexual activity: Not on file  Lifestyle  . Physical activity    Days per week: Not on file    Minutes per session: Not on file  . Stress: Not on file  Relationships  . Social Musicianconnections    Talks on phone: Not on file    Gets together: Not on file    Attends religious service: Not on file    Active member of club or organization: Not on file    Attends meetings of clubs or organizations: Not on file    Relationship status: Not on file  . Intimate partner violence    Fear of current or ex partner: Not on file    Emotionally abused: Not on file    Physically abused: Not on file    Forced sexual activity: Not on file  Other Topics Concern  . Not on file  Social History Narrative   Right handed    Caffeine 2 cups of tea daily   Lives with daughter     ALLERGIES:    Allergies  Allergen Reactions  . Codeine Nausea Only  . Lactose Diarrhea    CURRENT MEDICATIONS:    Current Outpatient Medications  Medication Sig Dispense Refill  . Alpha Lipoic Acid 200 MG CAPS Take by mouth.    . ALPRAZolam (XANAX) 0.25 MG tablet TK 1 T PO D PRN    . apixaban (ELIQUIS) 5 MG TABS tablet TAKE 2 TABLETS TWICE A DAY FOR 7 DAYS THEN 1 TABLET TWICE DAILY THEREAFTER - REPLACES CLOPIDOGREL    . cephALEXin (KEFLEX) 500 MG capsule TAKE 1 CAPSULE BY MOUTH FOUR TIMES A DAY FOR 7 DAYS  0  . Cholecalciferol (VITAMIN D3 PO) Take by mouth. 2000 units    . co-enzyme Q-10 30 MG capsule Take 30 mg by mouth 3 (three) times daily.    . Cyanocobalamin (VITAMIN B 12 PO) Take by mouth.    . DEXILANT 60 MG capsule Take 1 capsule by mouth 2 (two) times daily.  3  . dicyclomine (BENTYL) 20 MG tablet Take by mouth.    . Docosahexaenoic Acid (DHA COMPLETE PO) Take by mouth.    . escitalopram (LEXAPRO) 5 MG tablet Take one tab PO qhs.    . estrogens, conjugated,  (PREMARIN) 0.625 MG tablet Take by mouth.    . famotidine (PEPCID) 20 MG tablet TAKE 1 TABLET BY MOUTH TWICE A DAY AS DIRECTED  5  . hydrochlorothiazide (HYDRODIURIL) 25 MG tablet TAKE 1 TABLET(25 MG) BY MOUTH DAILY    . isometheptene-acetaminophen-dichloralphenazone (MIDRIN) 65-100-325 MG capsule Take by mouth.    . lidocaine (LIDODERM) 5 % APPLY 1 PATCH D    . memantine (NAMENDA) 5 MG tablet Take 1 tablet daily for one week, then take 1 tablet twice daily for one week, then take 1 tablet in the morning and 2 in the evening for one week, then take 2 tablets twice daily 70 tablet 0  . mometasone (ELOCON) 0.1 % cream APPLY TO AFFECTED AREA TWICE A DAY FOR 14 DAYS  0  . Probiotic Product (PROBIOTIC-10 PO) Take by mouth.    . ranitidine (ZANTAC) 150 MG tablet TAKE 1 TABLET BY MOUTH TWICE A DAY ONE HOUR BEFORE MEALS  0  . sucralfate (CARAFATE) 1 GM/10ML suspension TAKE 2 TEASPOONSFULL BY MOUTH TWICE DAILY FOR ESPHEAGEAL SPASM    . topiramate (TOPAMAX) 25 MG tablet TAKE 6 TABLETS BY MOUTH AT BEDTIME AS NEEDED FOR HEADACHE PREVENTION    . traMADol (ULTRAM) 50 MG tablet Take by mouth.     No current facility-administered medications for this visit.     REVIEW OF SYSTEMS:   [X]  denotes positive finding, [ ]  denotes negative finding Cardiac  Comments:  Chest pain or chest pressure:    Shortness of breath upon exertion:    Short of breath when lying flat:    Irregular heart rhythm: x       Vascular    Pain in calf, thigh, or hip brought on by ambulation: x   Pain in feet at night that wakes you up from your sleep:  x   Blood clot in your veins: x   Leg swelling:  x       Pulmonary    Oxygen at home:    Productive cough:     Wheezing:         Neurologic    Sudden weakness in arms or legs:     Sudden numbness in arms or legs:     Sudden onset of difficulty speaking or slurred speech:  Temporary loss of vision in one eye:     Problems with dizziness:  x       Gastrointestinal     Blood in stool:      Vomited blood:         Genitourinary    Burning when urinating:     Blood in urine:        Psychiatric    Major depression:         Hematologic    Bleeding problems:    Problems with blood clotting too easily:        Skin    Rashes or ulcers:        Constitutional    Fever or chills:     PHYSICAL EXAM:   Vitals:   11/08/18 1338  BP: (!) 142/69  Pulse: 77  Resp: 20  Temp: 97.8 F (36.6 C)  TempSrc: Oral  SpO2: 97%  Weight: 46.3 kg  Height: 5\' 1"  (1.549 m)    GENERAL: The patient is a well-nourished female, in no acute distress. The vital signs are documented above. CARDIAC: There is a regular rate and rhythm.  VASCULAR: Pedal pulses are nonpalpable.  1-2+ pitting edema bilaterally PULMONARY: Nonlabored respirations ABDOMEN: Soft and non-tender with normal pitched bowel sounds.  MUSCULOSKELETAL: There are no major deformities or cyanosis. NEUROLOGIC: No focal weakness or paresthesias are detected. SKIN: Skin discoloration with evidence of healed ulcers on both medial ankles PSYCHIATRIC: The patient has a normal affect.  STUDIES:   I have reviewed the following:    Venous Reflux Times Normal value < 0.5 sec  +------------------------------+----------+---------+                 Right (ms)Left (ms)  +------------------------------+----------+---------+  CFV                   2743.00   +------------------------------+----------+---------+  GSV at Saphenofemoral junction653.00  3044.00   +------------------------------+----------+---------+  GSV prox thigh             653.00    +------------------------------+----------+---------+  GSV mid thigh              675.00    +------------------------------+----------+---------+  GSV dist thigh        689.00  506.00    +------------------------------+----------+---------+  GSV at knee                521.00    +------------------------------+----------+---------+  GSV prox calf              521.00    +------------------------------+----------+---------+  GSV mid calf              528.00    +------------------------------+----------+---------+   Vein Diameters:  +------------------------------+--------------+--------------+                 Right (cm)  Left (cm)     +------------------------------+--------------+--------------+  GSV at Saphenofemoral junction0.684     0.513       +------------------------------+--------------+--------------+  GSV at prox thigh       0.435     0.462       +------------------------------+--------------+--------------+  GSV at mid thigh       0.497     0.358       +------------------------------+--------------+--------------+  GSV at distal thigh      0.381     0.373       +------------------------------+--------------+--------------+  GSV at knee          0.365     0.285       +------------------------------+--------------+--------------+  GSV prox calf         0.308     0.381       +------------------------------+--------------+--------------+  GSV mid calf         0.335     0.301       +------------------------------+--------------+--------------+  SSV origin          Not visualizedNot visualized  +------------------------------+--------------+--------------+  SSV prox           Not visualizedNot visualized  +------------------------------+--------------+--------------+  SSV mid            Not visualizedNot visualized  +------------------------------+--------------+--------------+   ASSESSMENT and PLAN   CEAP class V: The patient has had recurrent wounds in both legs.  I do not feel that based off  her venous reflux study that she would receive significant benefit from laser ablation.  I suspect she has venous insufficiency superimposed on chronic lymphedema tarda.  Unfortunately because of age and arthritis, she is having difficulty getting compression socks in place.  She finds it hard to keep her legs elevated as she is standing most of the day.  I feel she would be a great candidate for external pneumatic compression and will attempt to get this approved.  She has a follow-up with me in 6 months.   Charlena CrossWells , IV, MD, FACS [facility] Tel [phone] Pager [phone]

## 2018-11-08 NOTE — Telephone Encounter (Signed)
Pt daughter(on DPR) is asking for a call to discuss the results.  Daughter has questions

## 2018-11-08 NOTE — Telephone Encounter (Signed)
Noted  

## 2018-12-28 ENCOUNTER — Ambulatory Visit: Payer: Medicare Other | Admitting: Podiatry

## 2019-02-07 ENCOUNTER — Other Ambulatory Visit: Payer: Self-pay

## 2019-02-07 ENCOUNTER — Ambulatory Visit: Payer: Medicare Other | Admitting: Surgery

## 2019-02-07 ENCOUNTER — Ambulatory Visit: Payer: 59 | Admitting: Surgery

## 2019-03-07 ENCOUNTER — Ambulatory Visit: Payer: Medicare Other | Admitting: Neurology

## 2020-12-05 ENCOUNTER — Other Ambulatory Visit: Payer: Self-pay | Admitting: Family Medicine

## 2020-12-05 DIAGNOSIS — Z8673 Personal history of transient ischemic attack (TIA), and cerebral infarction without residual deficits: Secondary | ICD-10-CM

## 2020-12-05 DIAGNOSIS — H532 Diplopia: Secondary | ICD-10-CM

## 2020-12-07 ENCOUNTER — Other Ambulatory Visit: Payer: Self-pay | Admitting: Family Medicine

## 2020-12-07 DIAGNOSIS — N6320 Unspecified lump in the left breast, unspecified quadrant: Secondary | ICD-10-CM

## 2020-12-21 ENCOUNTER — Other Ambulatory Visit: Payer: Self-pay

## 2020-12-21 ENCOUNTER — Ambulatory Visit
Admission: RE | Admit: 2020-12-21 | Discharge: 2020-12-21 | Disposition: A | Discharge status: Home / Self Care | Payer: Medicare Other | Source: Ambulatory Visit | Attending: Family Medicine | Admitting: Family Medicine

## 2020-12-21 DIAGNOSIS — H532 Diplopia: Secondary | ICD-10-CM

## 2020-12-21 DIAGNOSIS — Z8673 Personal history of transient ischemic attack (TIA), and cerebral infarction without residual deficits: Secondary | ICD-10-CM

## 2020-12-21 IMAGING — MR MR HEAD WO/W CM
12 series · 48 of 48 positions shown · IV contrast (multihance)
Comparison: Head CT without contrast [DATE].

CLINICAL DATA: [AGE] female with 2 years of double vision.
History of stroke 7 years ago.

EXAM:
MRI HEAD WITHOUT AND WITH CONTRAST
TECHNIQUE: Multiplanar, multiecho pulse sequences of the brain and surrounding
structures were obtained without and with intravenous contrast.
CONTRAST:  10mL MULTIHANCE GADOBENATE DIMEGLUMINE 529 MG/ML IV SOLN

[Series 2: T1 · sagittal · 5.0mm · 0.45mm/px · 2 of 23 slices shown]
[im 1/23]
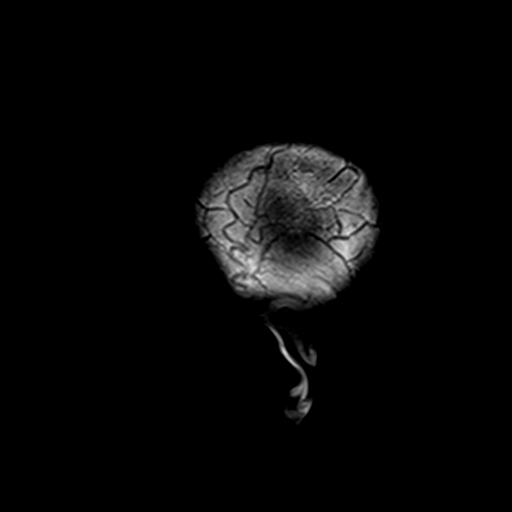
[im 23/23]
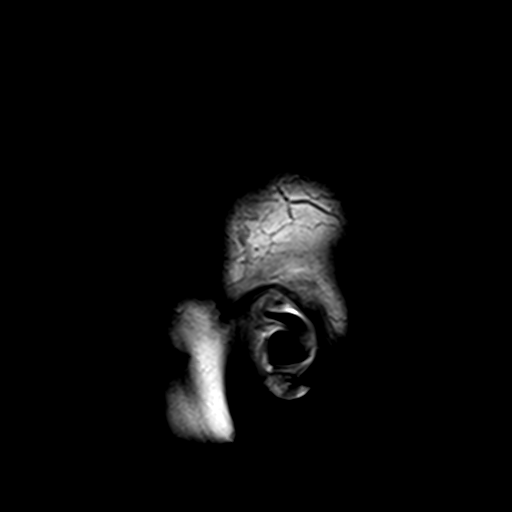

[Series 3: DWI · axial · 3.0mm · 1.80mm/px · z∈[-64,+82]mm · 7 of 96 slices shown (1 of 4)]
[im 1/96]
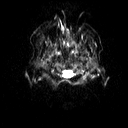
[im 16/96]
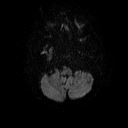
[im 32/96]
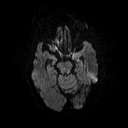
[im 48/96]
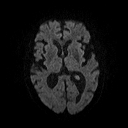
[im 64/96]
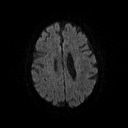
[im 80/96]
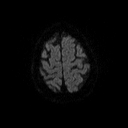
[im 96/96]
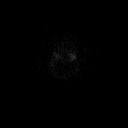

[Series 4: DWI · axial · 3.0mm · 1.80mm/px · z∈[-64,+82]mm · 3 of 49 slices shown (2 of 4)]
[im 1/49]
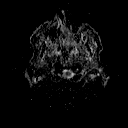
[im 25/49]
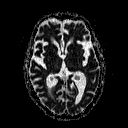
[im 49/49]
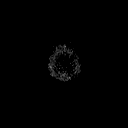

[Series 5: DWI · coronal · 5.0mm · 1.80mm/px · 5 of 70 slices shown (3 of 4)]
[im 1/70]
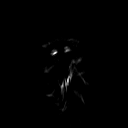
[im 18/70]
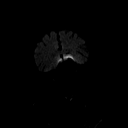
[im 35/70]
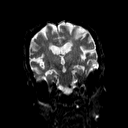
[im 52/70]
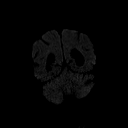
[im 70/70]
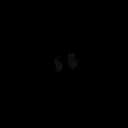

[Series 6: DWI · coronal · 5.0mm · 1.80mm/px · 2 of 35 slices shown (4 of 4)]
[im 1/35]
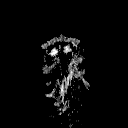
[im 35/35]
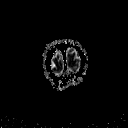

[Series 7: T2 · axial · 5.0mm · 0.60mm/px · 1 of 22 slices shown (1 of 2)]
[im 1/22]
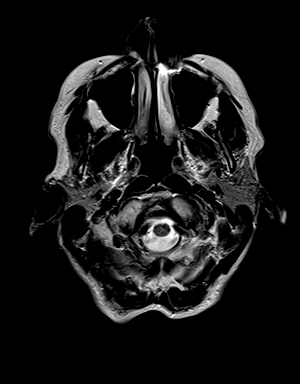

[Series 8: FLAIR · axial · 3.0mm · 0.45mm/px · z∈[-58,+76]mm · 2 of 30 slices shown]
[im 1/30]
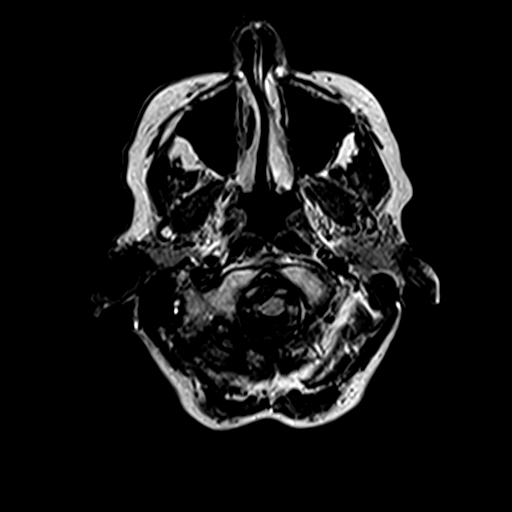
[im 30/30]
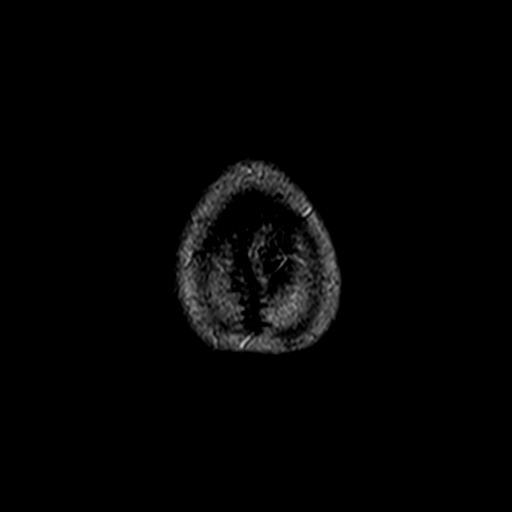

[Series 10: swi_images · axial · 4.0mm · 0.90mm/px · z∈[-60,+79]mm · 2 of 36 slices shown]
[im 1/36]
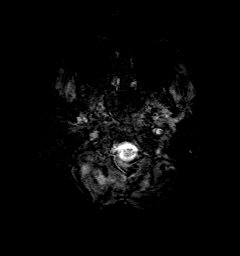
[im 36/36]
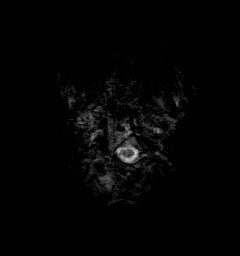

[Series 11: t1_mpr_tra · axial · 1.0mm · 0.75mm/px · z∈[-63,+82]mm · 10 of 144 slices shown]
[im 1/144]
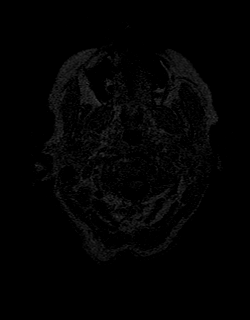
[im 16/144]
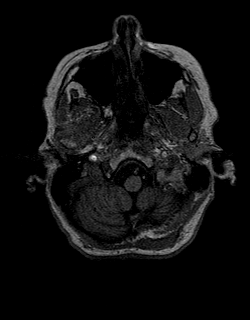
[im 32/144]
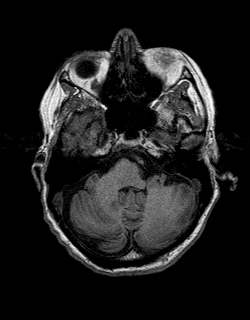
[im 48/144]
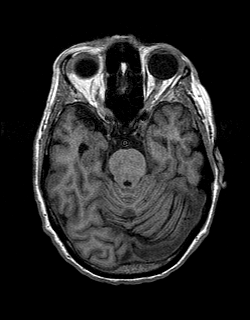
[im 64/144]
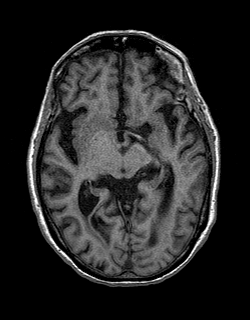
[im 80/144]
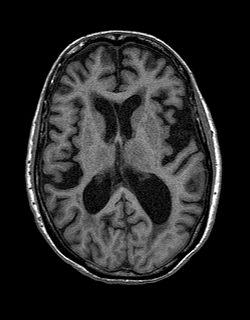
[im 96/144]
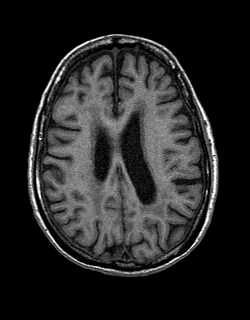
[im 112/144]
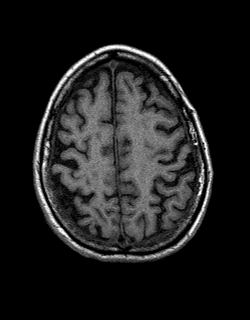
[im 128/144]
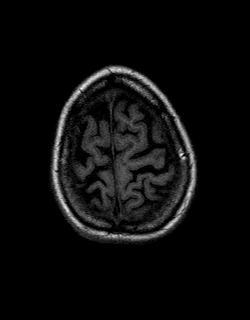
[im 144/144]
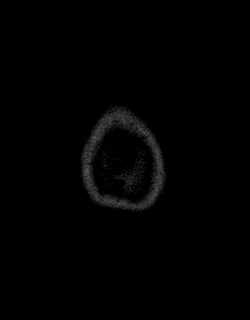

[Series 12: T2 · coronal · 5.0mm · 0.45mm/px · 2 of 28 slices shown (2 of 2)]
[im 1/28]
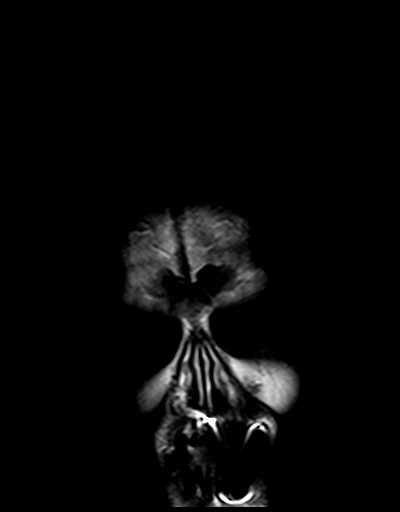
[im 28/28]
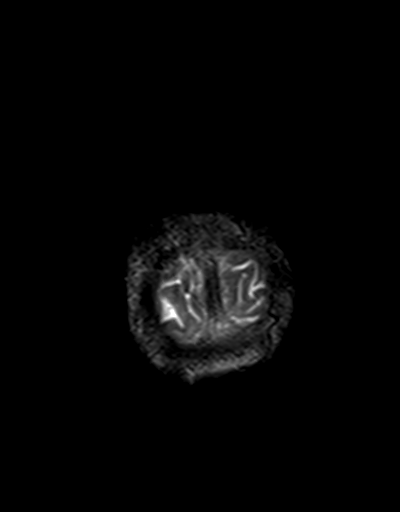

[Series 13: t1_mpr_tra post · axial · 1.0mm · 0.75mm/px · z∈[-63,+82]mm · 10 of 144 slices shown]
[im 1/144]
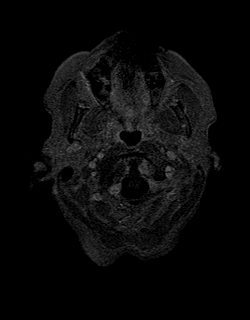
[im 16/144]
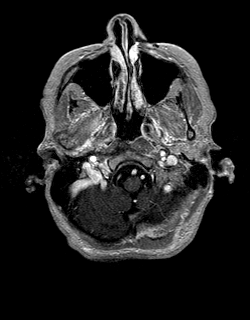
[im 32/144]
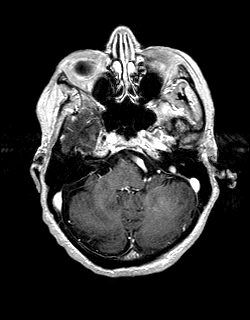
[im 48/144]
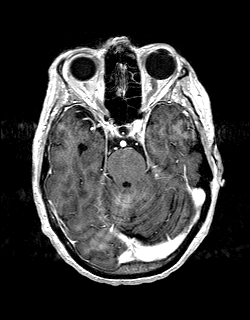
[im 64/144]
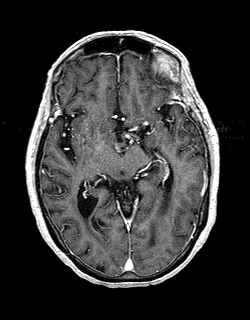
[im 80/144]
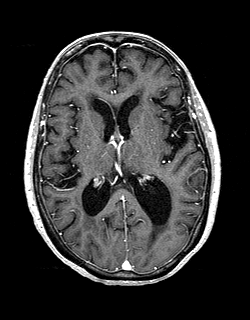
[im 96/144]
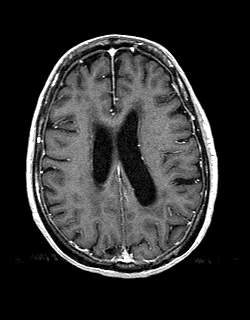
[im 112/144]
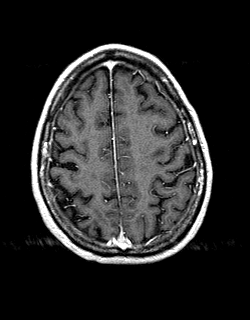
[im 128/144]
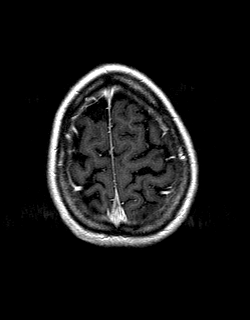
[im 144/144]
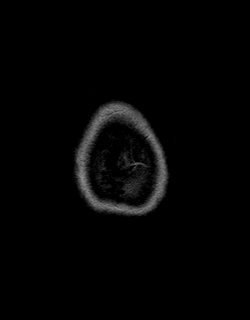

[Series 14: post cor · coronal · 5.0mm · 0.45mm/px · 2 of 28 slices shown]
[im 1/28]
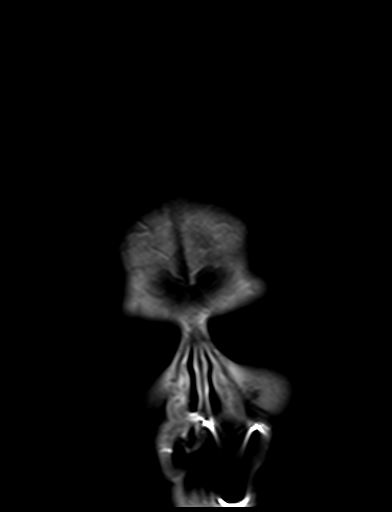
[im 28/28]
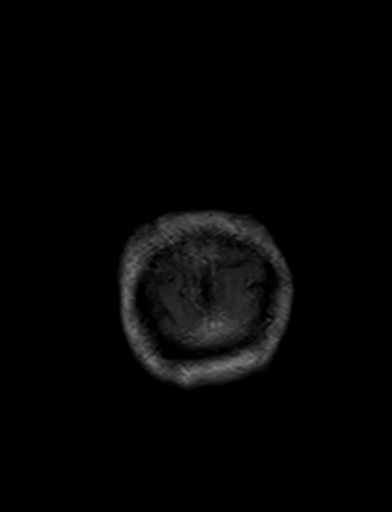

[48 of 48 positions shown; findings below may reference images not displayed]

FINDINGS: Brain: No convincing restricted diffusion to suggest acute
infarction.

There are multiple small chronic infarcts in the bilateral
cerebellum, more pronounced on the left. There is patchy asymmetric
T2 and FLAIR hyperintensity in the right pons (series 7, image 7).
There are multiple small chronic cortically based infarcts in the
superior MCA territories including pre motor cortex on the left and
postcentral cortex on the right (series 8, image 24), stable since
[VN]. Subtle occipital lobe chronic cortical infarcts more apparent
on the right (series 8, image 14). Patchy and confluent superimposed
bilateral cerebral white matter T2 and FLAIR hyperintensity.
Superimposed mild to moderate T2 heterogeneity in the bilateral deep
gray matter nuclei. No chronic cerebral blood products are evident
on SWI.

No abnormal enhancement identified. No dural thickening.

No midline shift, mass effect, evidence of mass lesion,
ventriculomegaly, extra-axial collection or acute intracranial
hemorrhage. Cervicomedullary junction and pituitary are within
normal limits.

Vascular: Major intracranial vascular flow voids are preserved.
There is generalized intracranial artery ectasia and tortuosity. The
major dural venous sinuses are enhancing and appear to be patent.

Skull and upper cervical spine: Partially visible cervical spine
degeneration and mild spinal stenosis. Visualized bone marrow signal
is within normal limits.

Sinuses/Orbits: Postoperative changes to both globes. Otherwise
negative orbits. Paranasal Visualized paranasal sinuses and mastoids
are stable and well aerated.

Other: Visible internal auditory structures appear normal. Negative
visible scalp and face.
IMPRESSION: 1. No acute intracranial abnormality.
2. Moderately advanced chronic ischemic disease, with no significant
progression evident from a [VN] Head CT.
3. Generalized intracranial artery ectasia, tortuosity.

## 2020-12-21 MED ORDER — GADOBENATE DIMEGLUMINE 529 MG/ML IV SOLN
10.0000 mL | Freq: Once | INTRAVENOUS | Status: AC | PRN
  Administered 2020-12-21: 10 mL via INTRAVENOUS

## 2021-05-01 ENCOUNTER — Ambulatory Visit
Admission: RE | Admit: 2021-05-01 | Discharge: 2021-05-01 | Disposition: A | Discharge status: Home / Self Care | Payer: Medicare Other | Source: Ambulatory Visit | Attending: Family Medicine | Admitting: Family Medicine

## 2021-05-01 DIAGNOSIS — N6320 Unspecified lump in the left breast, unspecified quadrant: Secondary | ICD-10-CM

## 2021-05-01 IMAGING — MG DIGITAL DIAGNOSTIC BILAT W/ TOMO W/ CAD
8 series · 9 of 24 positions shown · non-contrast
Comparison: Previous exam(s).

CLINICAL DATA: Patient reports left breast lumpiness and pain,
mostly along the lateral breast and towards the left axilla.

EXAM:
DIGITAL DIAGNOSTIC BILATERAL MAMMOGRAM WITH TOMOSYNTHESIS AND CAD;
ULTRASOUND LEFT BREAST LIMITED
TECHNIQUE: Bilateral digital diagnostic mammography and breast tomosynthesis
was performed. The images were evaluated with computer-aided
detection.; Targeted ultrasound examination of the left breast was
performed.

[L MLO synth-2D]
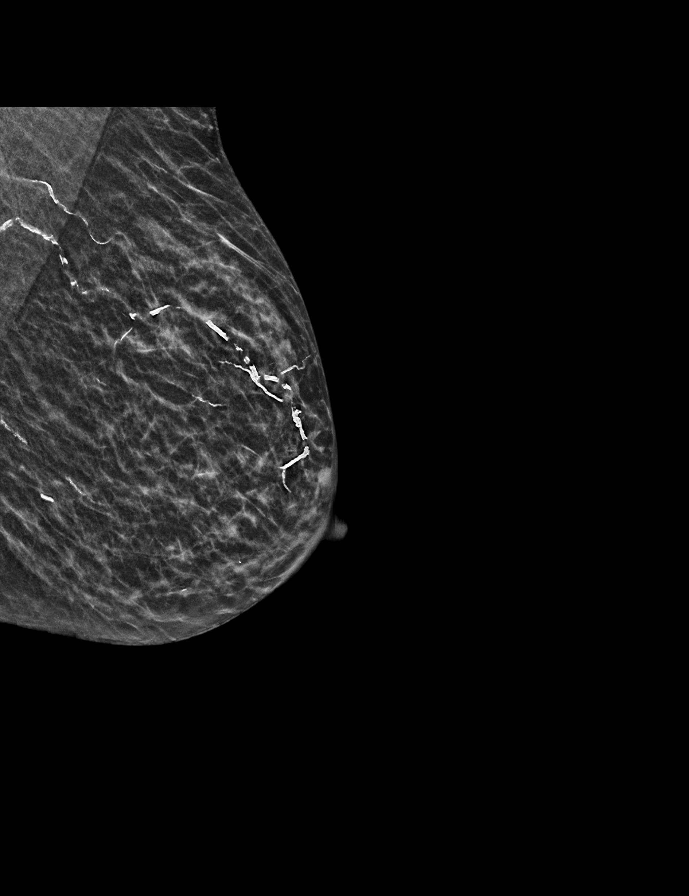

[R CC synth-2D]
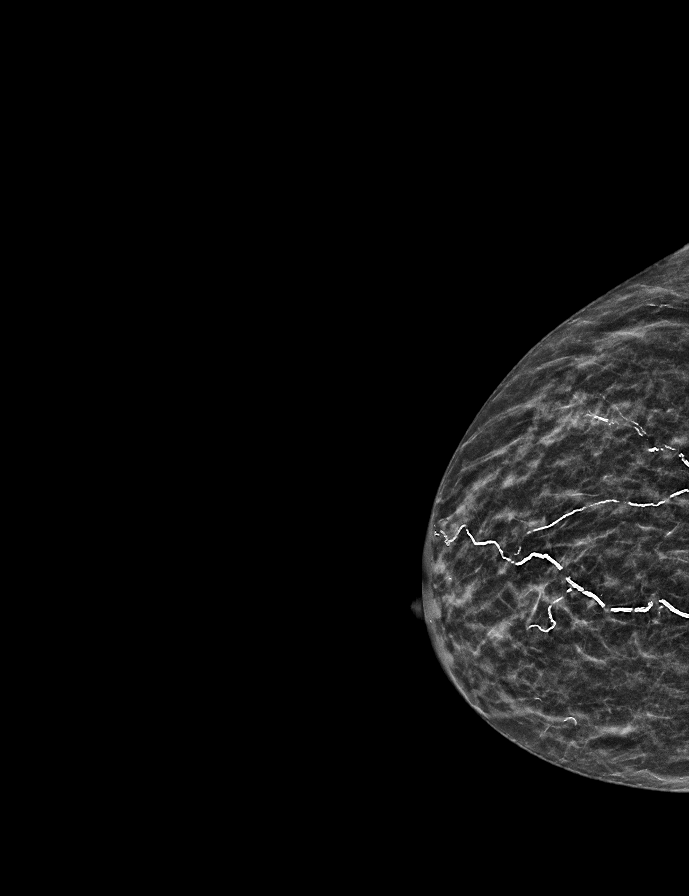

[L CC synth-2D]
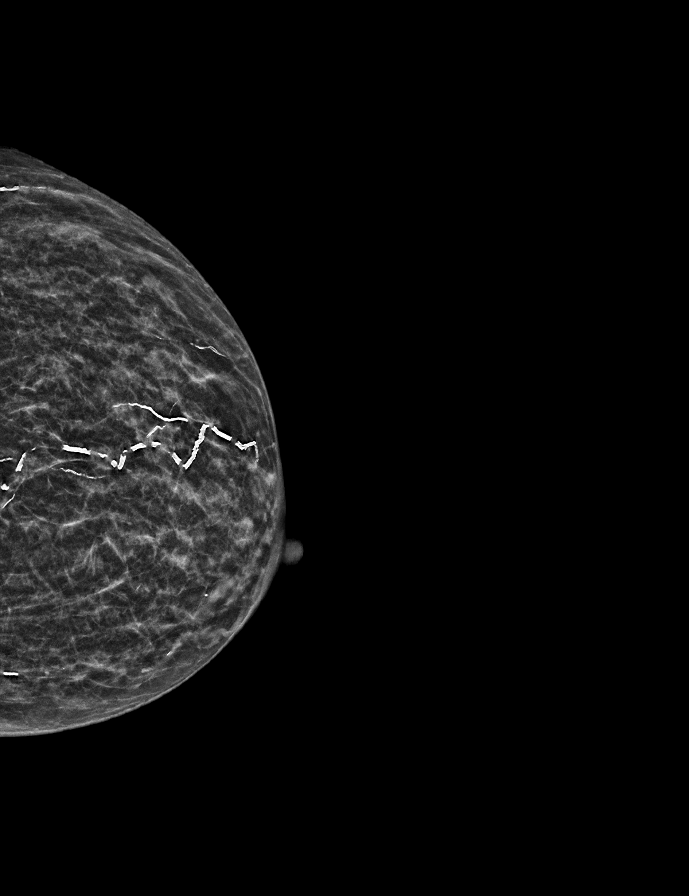

[R MLO synth-2D]
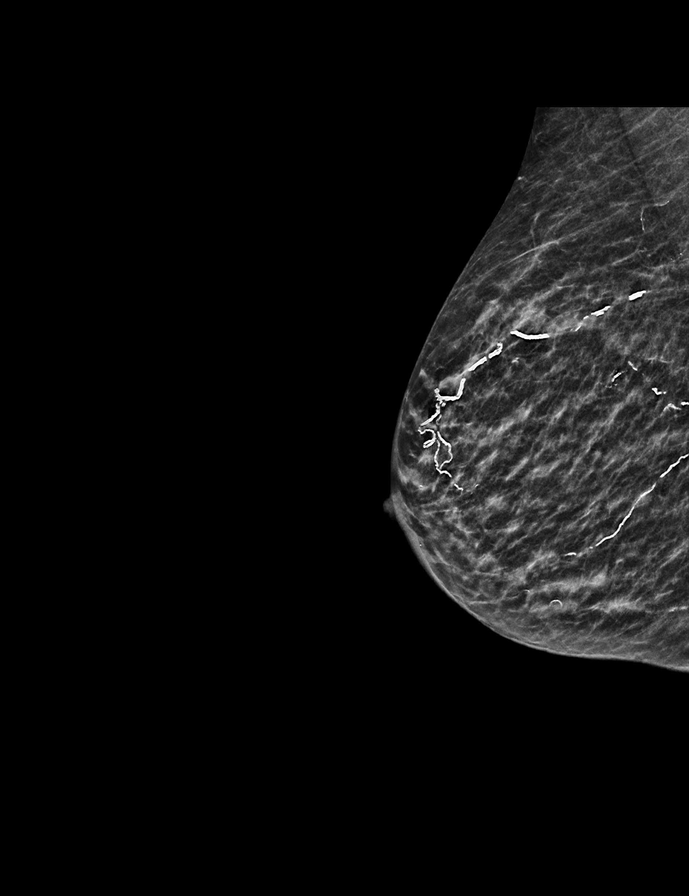

[L MLO tomo · 2 of 34 frames shown]
[frame 12/34]
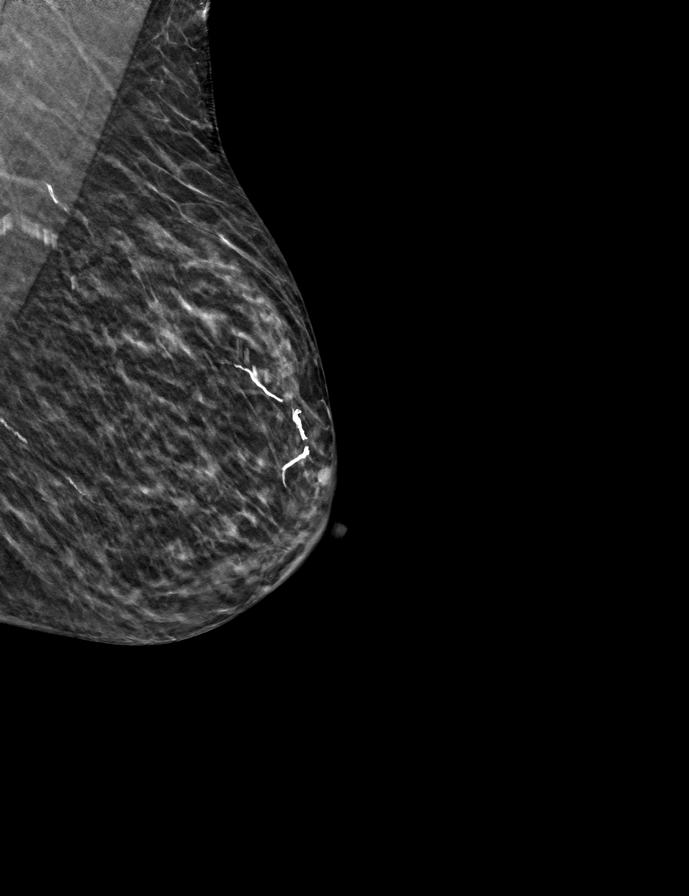
[frame 17/34]
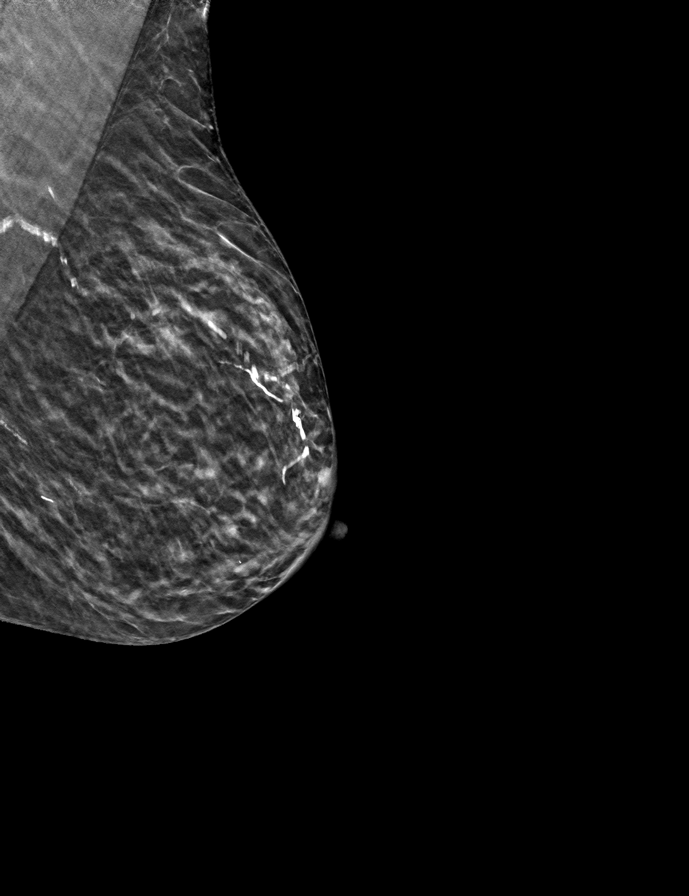

[R CC tomo · tomo slice 17/32.0]
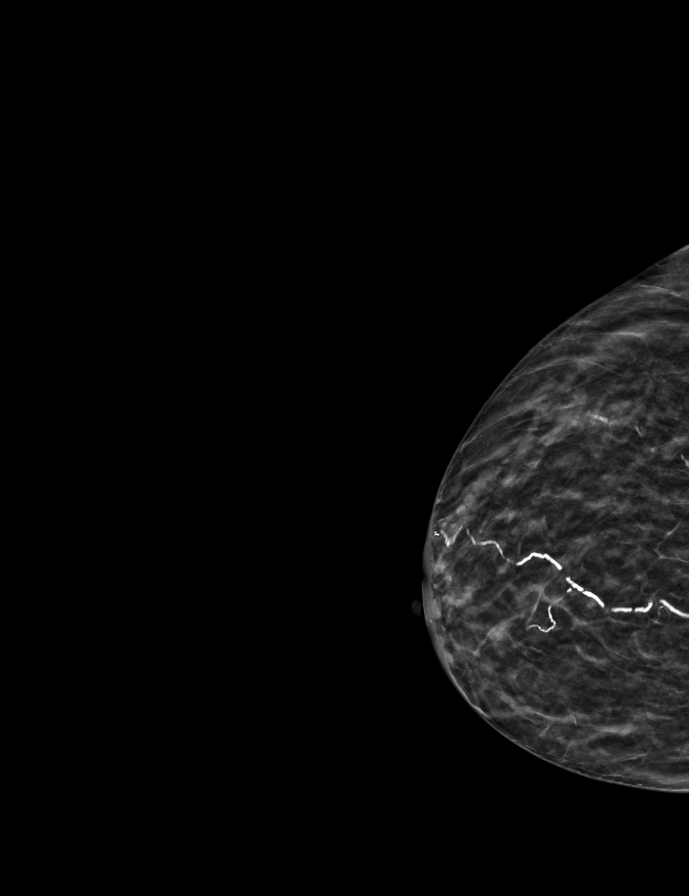

[R MLO tomo · tomo slice 17/33.0]
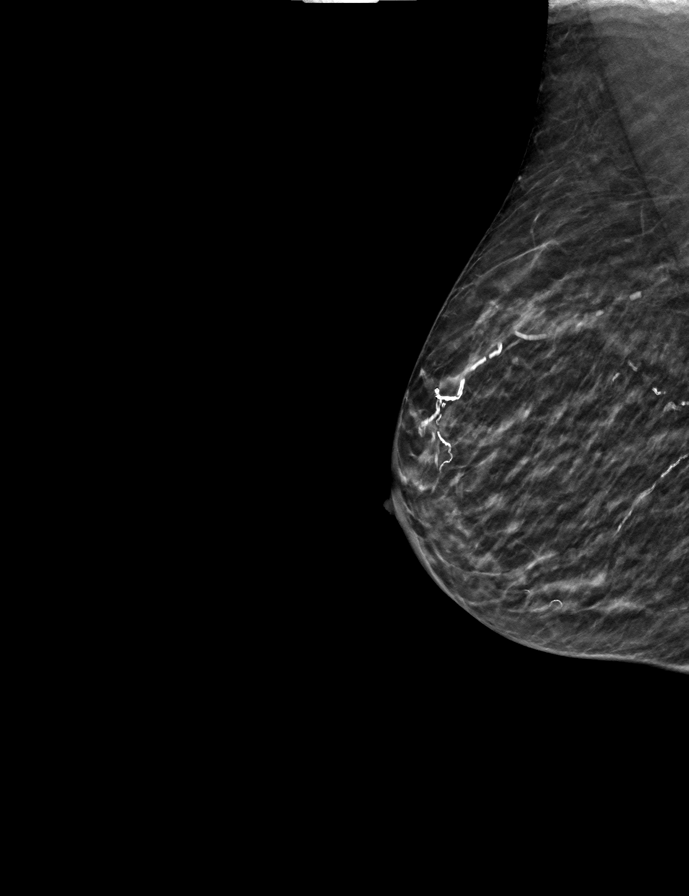

[L CC tomo · tomo slice 15/30.0]
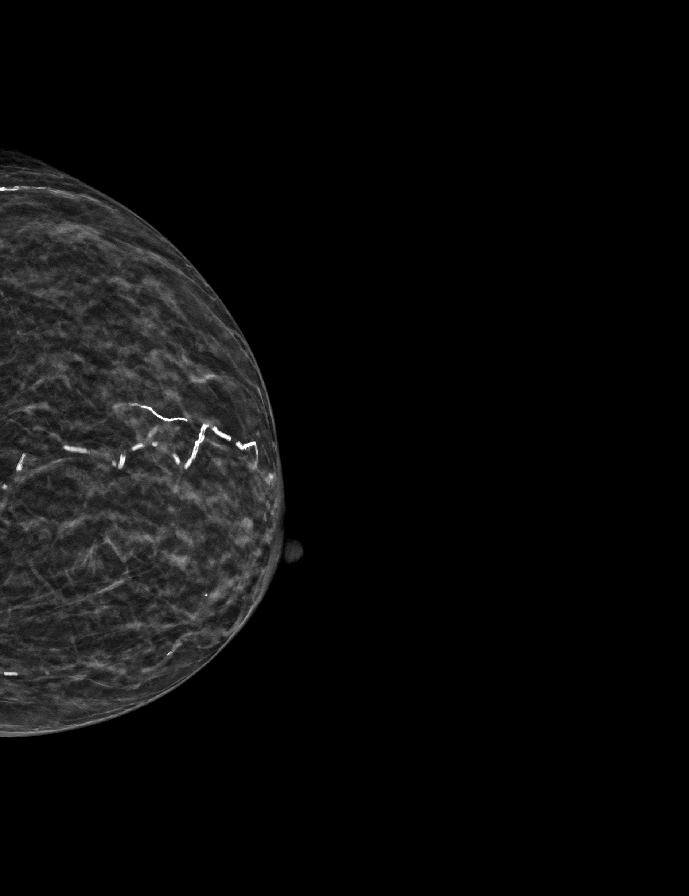

[9 of 24 positions shown; findings below may reference images not displayed]

ACR Breast Density Category b: There are scattered areas of
fibroglandular density.
FINDINGS: There are no masses, areas of architectural distortion, areas of
significant asymmetry or suspicious calcifications. No mammographic
change.

On physical exam, no discrete mass is palpated left breast.

Targeted ultrasound is performed, showing normal tissue throughout
the upper outer and lateral left breast. No mass or suspicious
lesion.
IMPRESSION: 1. No evidence of breast malignancy.

RECOMMENDATION:
Screening mammogram in one year.(Code:[S1])

I have discussed the findings and recommendations with the patient.
If applicable, a reminder letter will be sent to the patient
regarding the next appointment.

BI-RADS CATEGORY  1: Negative.

## 2021-05-01 IMAGING — US US BREAST*L* LIMITED INC AXILLA
1 series · 2 of 2 positions shown · non-contrast
Comparison: Previous exam(s).

CLINICAL DATA: Patient reports left breast lumpiness and pain,
mostly along the lateral breast and towards the left axilla.

EXAM:
DIGITAL DIAGNOSTIC BILATERAL MAMMOGRAM WITH TOMOSYNTHESIS AND CAD;
ULTRASOUND LEFT BREAST LIMITED
TECHNIQUE: Bilateral digital diagnostic mammography and breast tomosynthesis
was performed. The images were evaluated with computer-aided
detection.; Targeted ultrasound examination of the left breast was
performed.

[Series 1: us breast*left* limited inc axilla · 0.06mm/px · 2 of 2 slices shown]
[im 1/2]
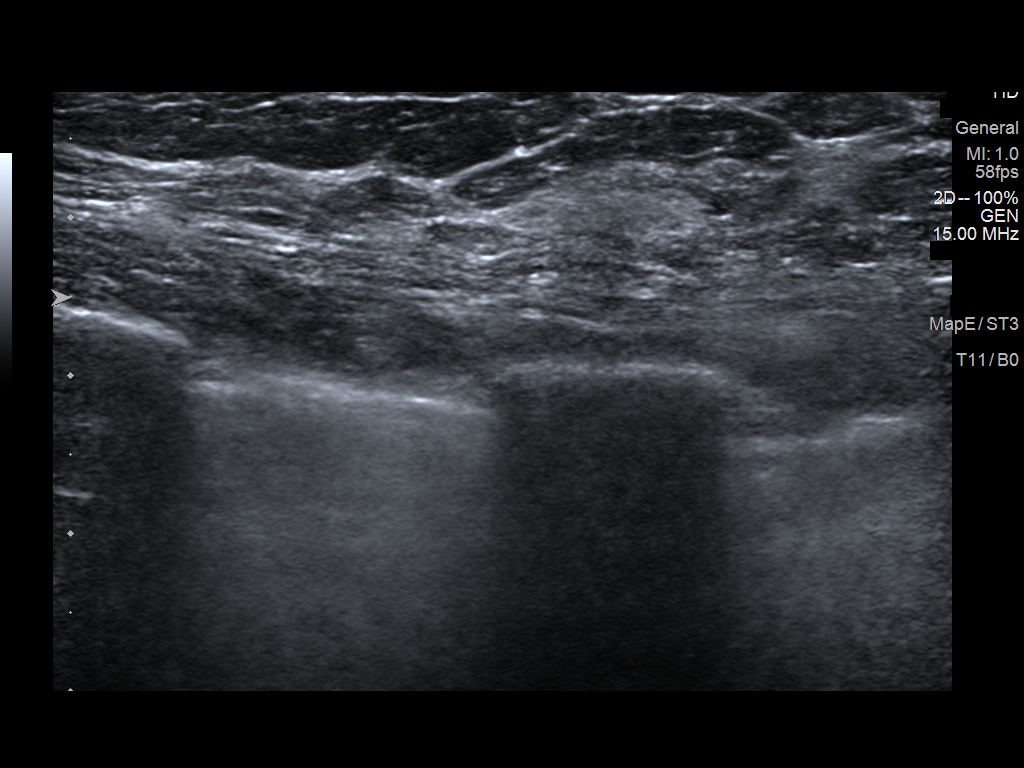
[im 2/2]
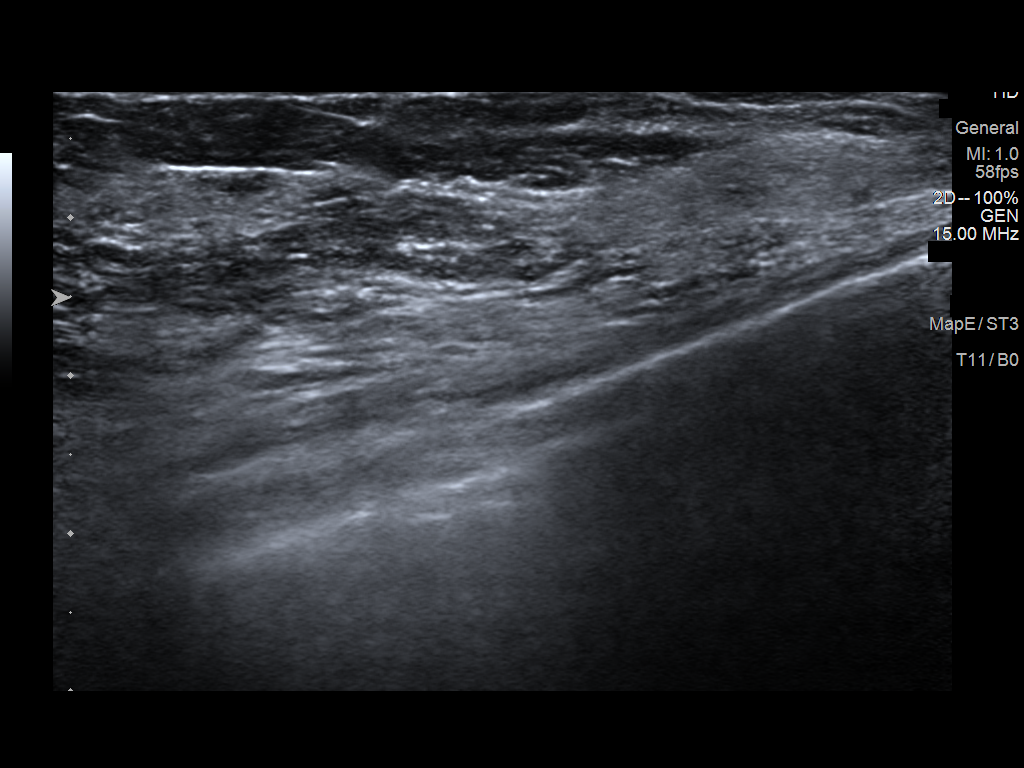

[2 of 2 positions shown; findings below may reference images not displayed]

ACR Breast Density Category b: There are scattered areas of
fibroglandular density.
FINDINGS: There are no masses, areas of architectural distortion, areas of
significant asymmetry or suspicious calcifications. No mammographic
change.

On physical exam, no discrete mass is palpated left breast.

Targeted ultrasound is performed, showing normal tissue throughout
the upper outer and lateral left breast. No mass or suspicious
lesion.
IMPRESSION: 1. No evidence of breast malignancy.

RECOMMENDATION:
Screening mammogram in one year.(Code:[S1])

I have discussed the findings and recommendations with the patient.
If applicable, a reminder letter will be sent to the patient
regarding the next appointment.

BI-RADS CATEGORY  1: Negative.

## 2021-08-03 ENCOUNTER — Other Ambulatory Visit: Payer: Self-pay

## 2021-08-03 ENCOUNTER — Emergency Department (HOSPITAL_COMMUNITY): Payer: Medicare HMO

## 2021-08-03 ENCOUNTER — Emergency Department (HOSPITAL_COMMUNITY)
Admission: EM | Admit: 2021-08-03 | Discharge: 2021-08-03 | Disposition: A | Discharge status: Skilled Nursing Facility | Payer: Medicare HMO | Attending: Emergency Medicine | Admitting: Emergency Medicine

## 2021-08-03 DIAGNOSIS — S4992XA Unspecified injury of left shoulder and upper arm, initial encounter: Secondary | ICD-10-CM | POA: Diagnosis present

## 2021-08-03 DIAGNOSIS — S42215A Unspecified nondisplaced fracture of surgical neck of left humerus, initial encounter for closed fracture: Secondary | ICD-10-CM

## 2021-08-03 DIAGNOSIS — Z7901 Long term (current) use of anticoagulants: Secondary | ICD-10-CM | POA: Diagnosis not present

## 2021-08-03 DIAGNOSIS — W07XXXA Fall from chair, initial encounter: Secondary | ICD-10-CM | POA: Insufficient documentation

## 2021-08-03 DIAGNOSIS — M542 Cervicalgia: Secondary | ICD-10-CM | POA: Diagnosis not present

## 2021-08-03 DIAGNOSIS — S0990XA Unspecified injury of head, initial encounter: Secondary | ICD-10-CM | POA: Diagnosis not present

## 2021-08-03 DIAGNOSIS — W19XXXA Unspecified fall, initial encounter: Secondary | ICD-10-CM

## 2021-08-03 LAB — CBC WITH DIFFERENTIAL/PLATELET
Abs Immature Granulocytes: 0.03 10*3/uL (ref 0.00–0.07)
Basophils Absolute: 0 10*3/uL (ref 0.0–0.1)
Basophils Relative: 0 %
Eosinophils Absolute: 0.2 10*3/uL (ref 0.0–0.5)
Eosinophils Relative: 3 %
HCT: 36.2 % (ref 36.0–46.0)
Hemoglobin: 12.1 g/dL (ref 12.0–15.0)
Immature Granulocytes: 0 %
Lymphocytes Relative: 29 %
Lymphs Abs: 2.7 10*3/uL (ref 0.7–4.0)
MCH: 32.2 pg (ref 26.0–34.0)
MCHC: 33.4 g/dL (ref 30.0–36.0)
MCV: 96.3 fL (ref 80.0–100.0)
Monocytes Absolute: 1.1 10*3/uL — ABNORMAL HIGH (ref 0.1–1.0)
Monocytes Relative: 12 %
Neutro Abs: 5.1 10*3/uL (ref 1.7–7.7)
Neutrophils Relative %: 56 %
Platelets: 258 10*3/uL (ref 150–400)
RBC: 3.76 MIL/uL — ABNORMAL LOW (ref 3.87–5.11)
RDW: 12.1 % (ref 11.5–15.5)
WBC: 9.1 10*3/uL (ref 4.0–10.5)
nRBC: 0 % (ref 0.0–0.2)

## 2021-08-03 LAB — BASIC METABOLIC PANEL
Anion gap: 9 (ref 5–15)
BUN: 27 mg/dL — ABNORMAL HIGH (ref 8–23)
CO2: 23 mmol/L (ref 22–32)
Calcium: 9.1 mg/dL (ref 8.9–10.3)
Chloride: 105 mmol/L (ref 98–111)
Creatinine, Ser: 0.94 mg/dL (ref 0.44–1.00)
GFR, Estimated: 57 mL/min — ABNORMAL LOW (ref >60)
Glucose, Bld: 155 mg/dL — ABNORMAL HIGH (ref 70–99)
Potassium: 3.8 mmol/L (ref 3.5–5.1)
Sodium: 137 mmol/L (ref 135–145)

## 2021-08-03 IMAGING — DX DG SHOULDER 2+V*L*
2 series · 2 of 2 positions shown · non-contrast
Comparison: None.

CLINICAL DATA: Fall, left shoulder pain

EXAM:
LEFT SHOULDER - 2+ VIEW

[shoulder axial]
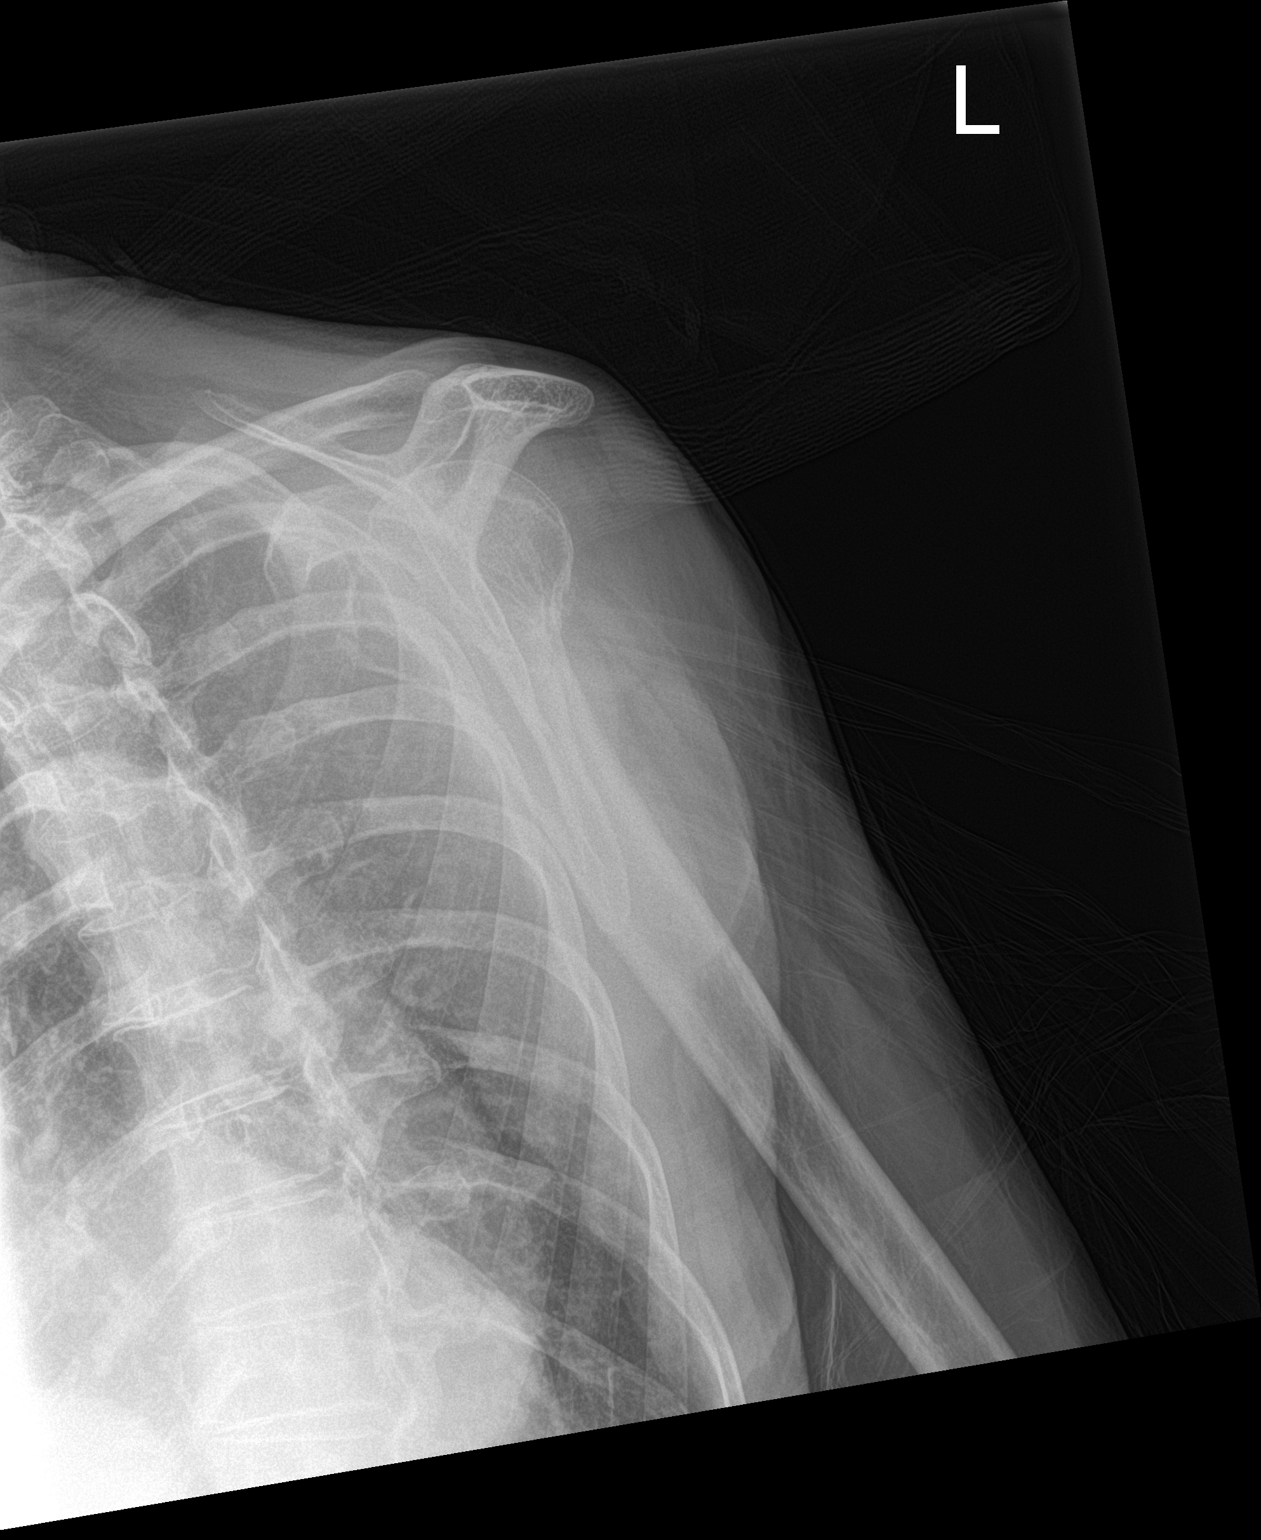

[shoulder ap]
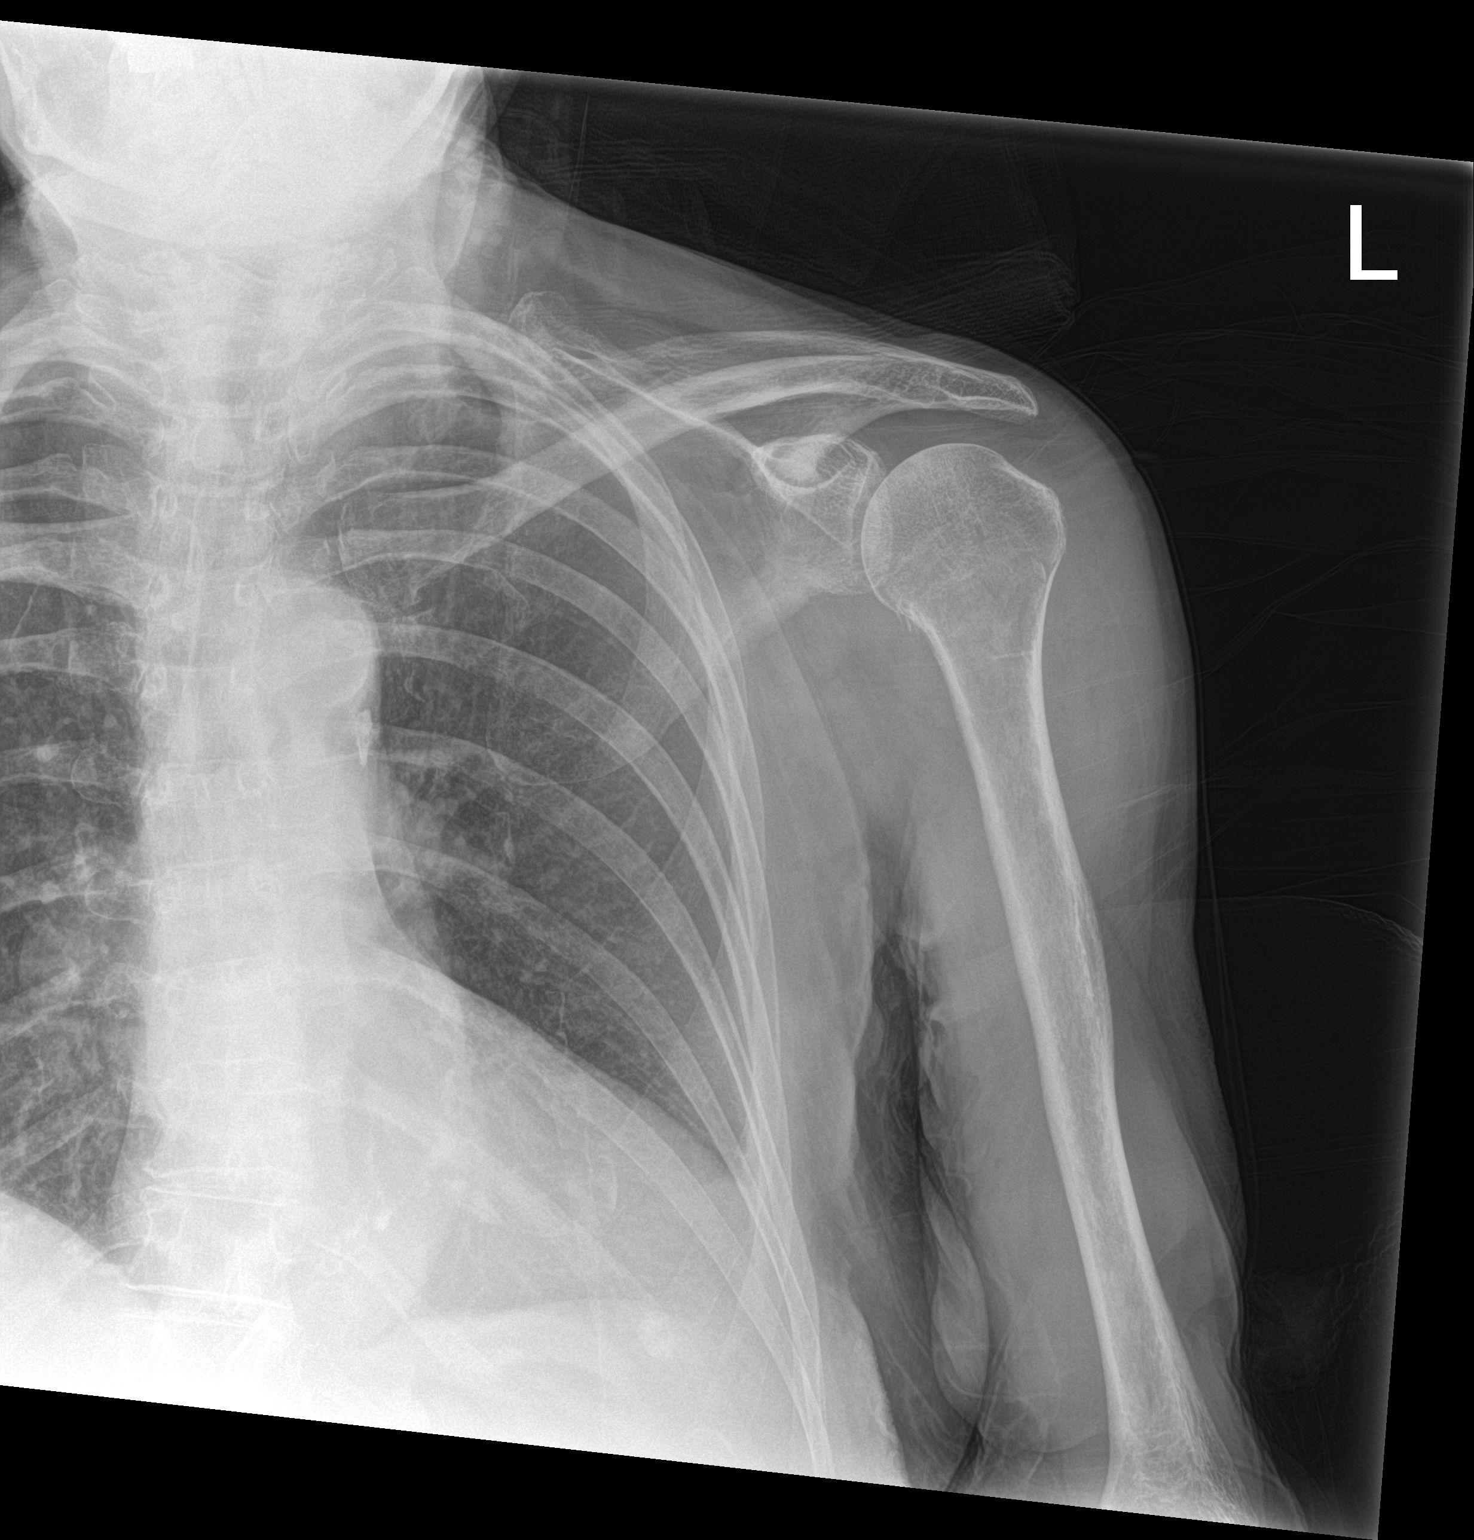

[2 of 2 positions shown; findings below may reference images not displayed]

FINDINGS: Normal alignment. There are cortical irregularities involving the
proximal metaphyseal region of the left humerus suggestive of a
minimally displaced fracture of the surgical neck of the left
humerus. Superimposed mild left glenohumeral degenerative arthritis.
Visualized left hemithorax is unremarkable.
IMPRESSION: Suspected nondisplaced fracture of the surgical neck of the left
humerus. This could be confirmed with CT imaging.

## 2021-08-03 IMAGING — DX DG HUMERUS 2V *L*
2 series · 2 of 2 positions shown · non-contrast
Comparison: None.

CLINICAL DATA: Fall, left shoulder pain

EXAM:
LEFT HUMERUS - 2+ VIEW

[humerus ap]
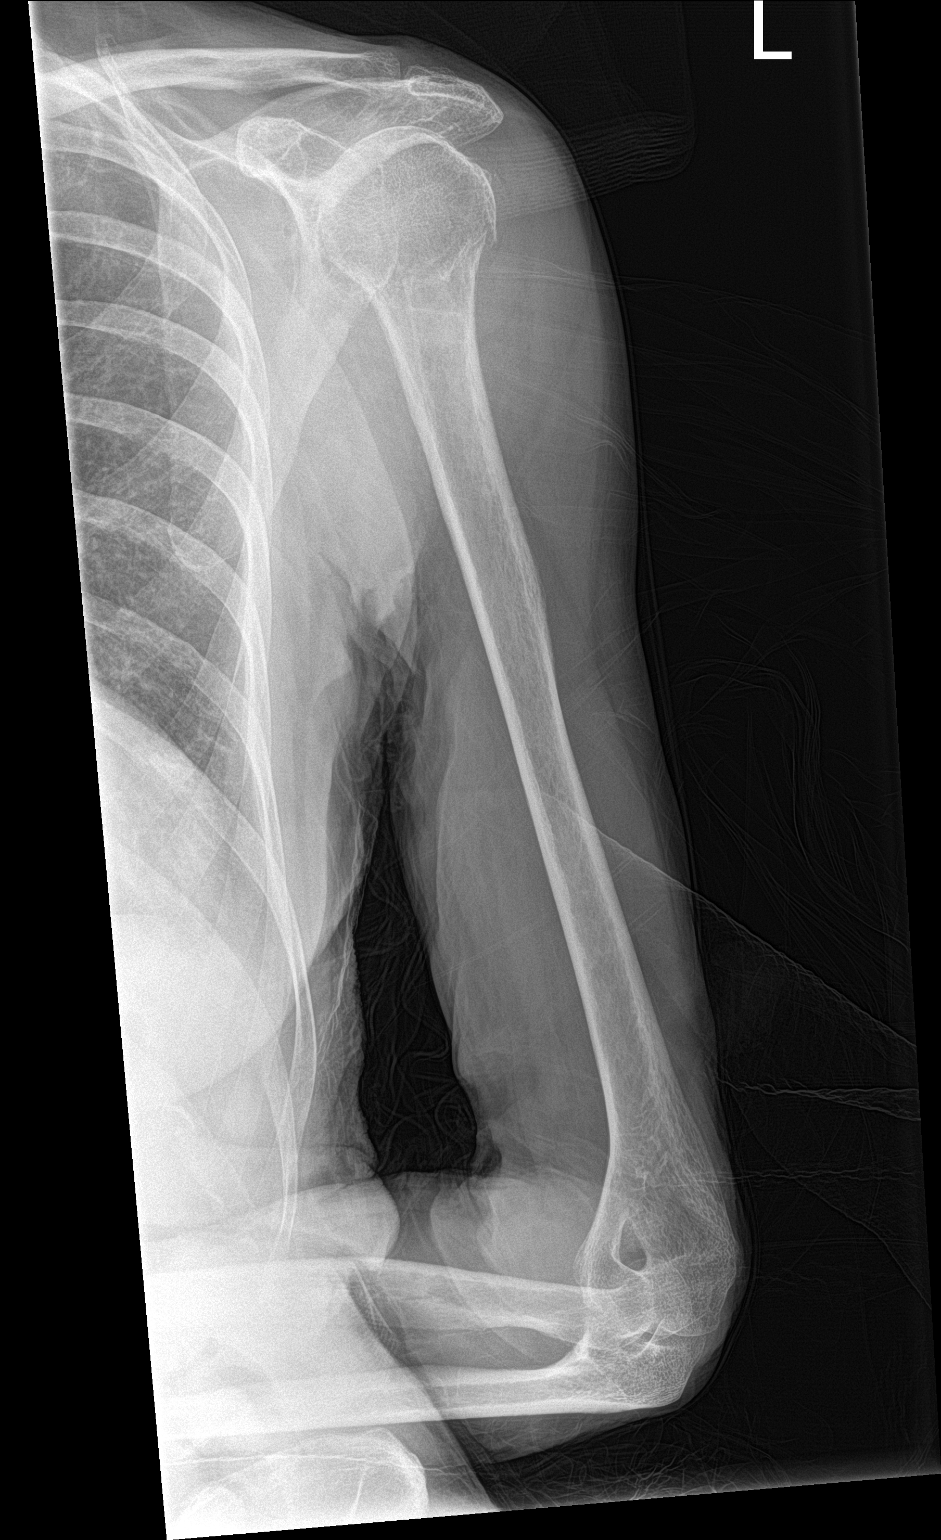

[humerus lat]
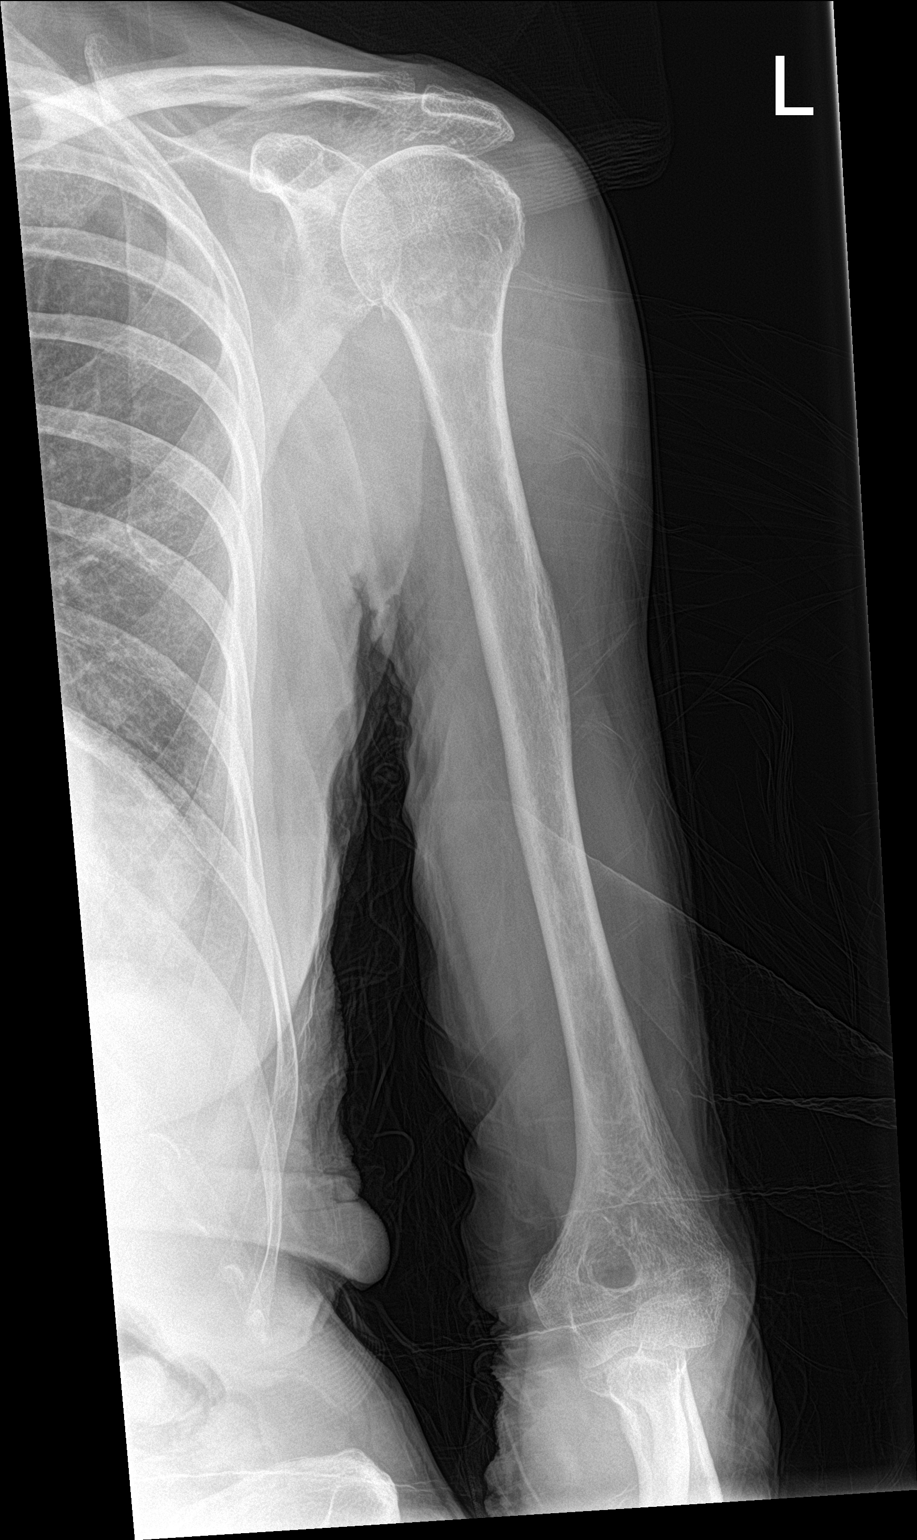

[2 of 2 positions shown; findings below may reference images not displayed]

FINDINGS: Normal overall alignment. There is sclerosis and cortical
irregularity involving the proximal metaphysis of the humerus
suggestive of a nondisplaced fracture of the surgical neck of the
humerus. No displaced fracture identified. Soft tissues are
unremarkable.
IMPRESSION: Suspected nondisplaced fracture of the surgical neck of the left
humerus. This could be confirmed with CT imaging.

## 2021-08-03 IMAGING — CT CT HEAD W/O CM
3 of 4 series · 15 of 47 positions shown, 18 images · non-contrast
Comparison: None.

CLINICAL DATA: Fall, left shoulder deformity



[Series 4: head wo · axial · 0.44mm/px · z∈[-148,-13]mm · 9 of 53 slices shown, 12 images]
[im 4/53  brain]
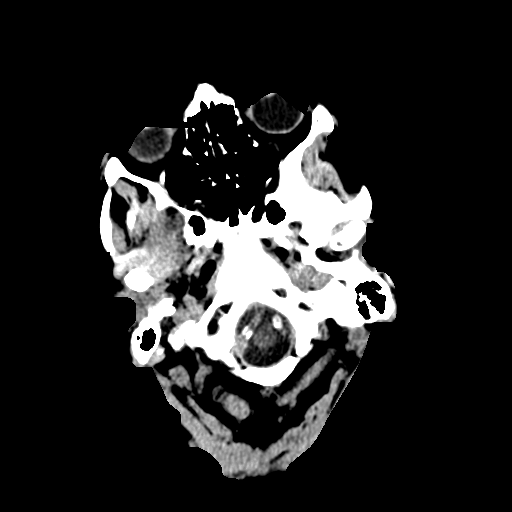
[im 4/53  bone]
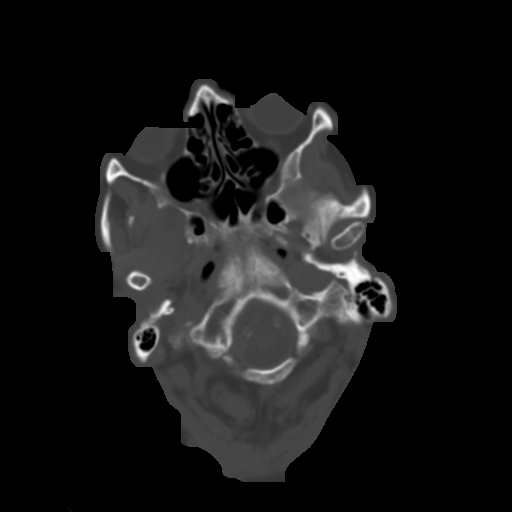
[im 12/53  brain]
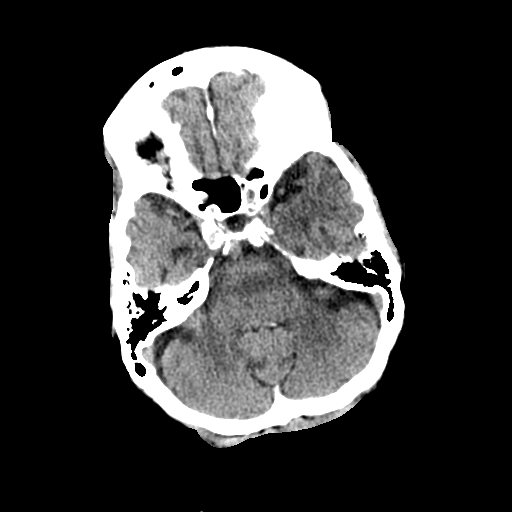
[im 15/53  brain]
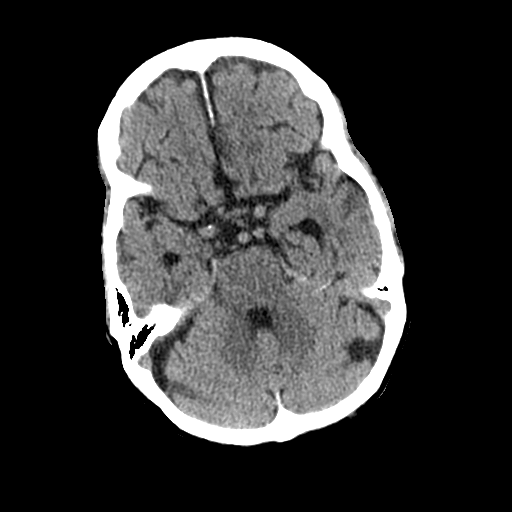
[im 23/53  brain]
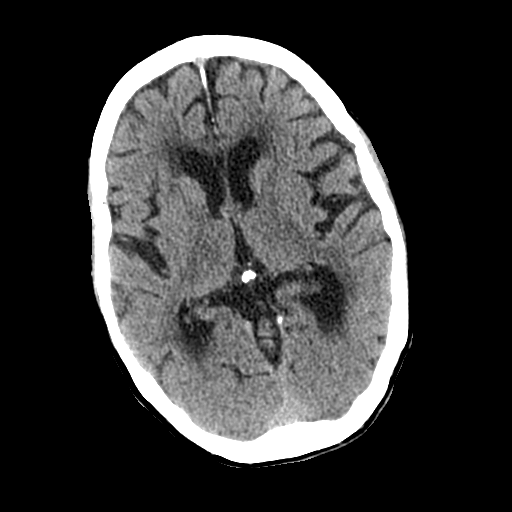
[im 27/53  brain]
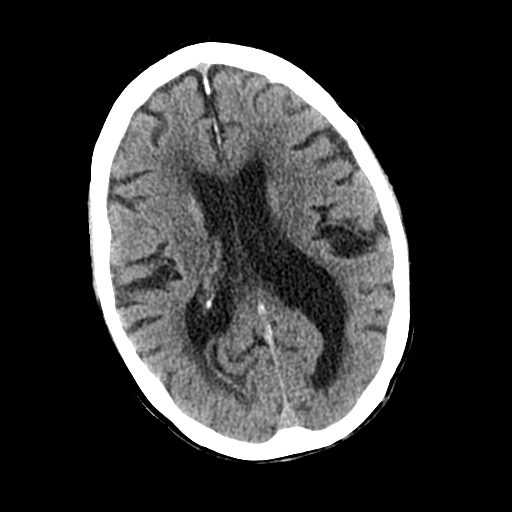
[im 27/53  bone]
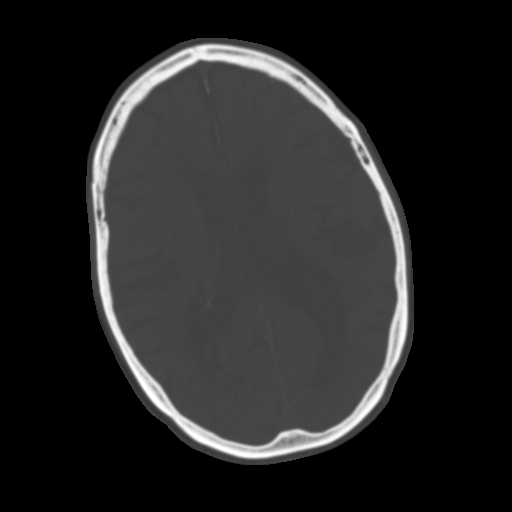
[im 30/53  brain]
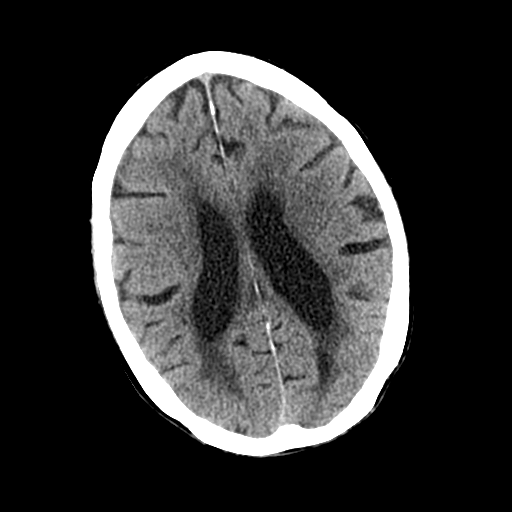
[im 38/53  brain]
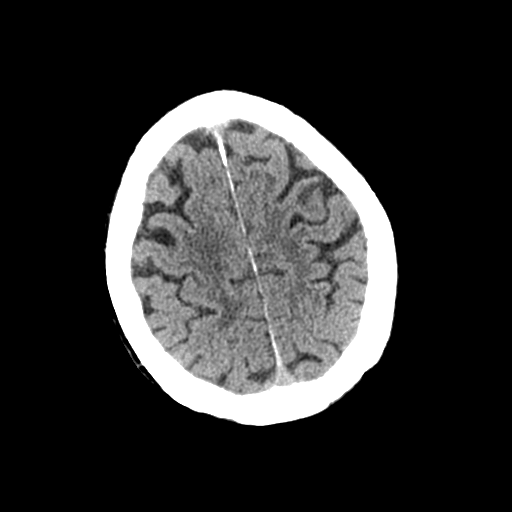
[im 41/53  brain]
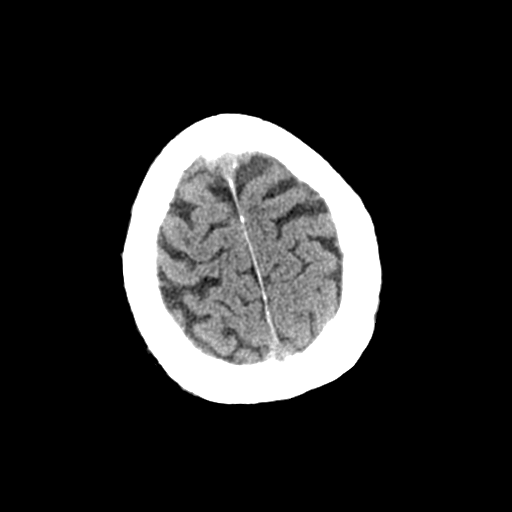
[im 49/53  brain]
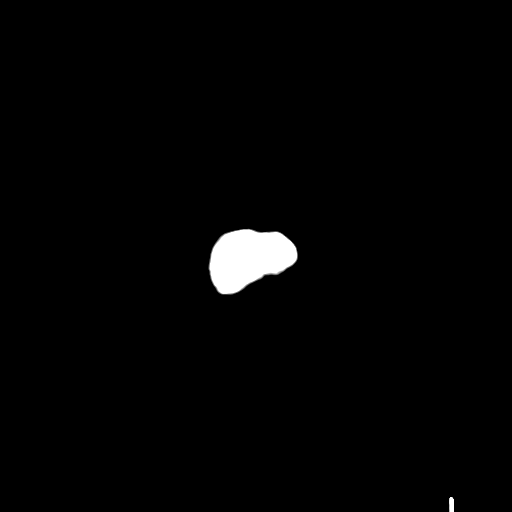
[im 49/53  bone]
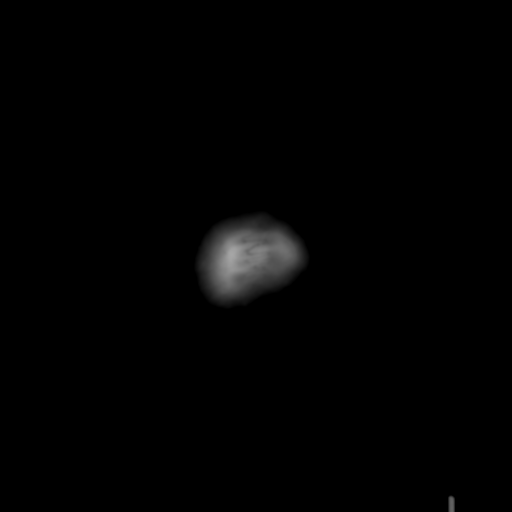

[Series 6: coronal soft tissue · coronal · 0.32mm/px · 3 of 73 slices shown]
[im 25/73  brain]
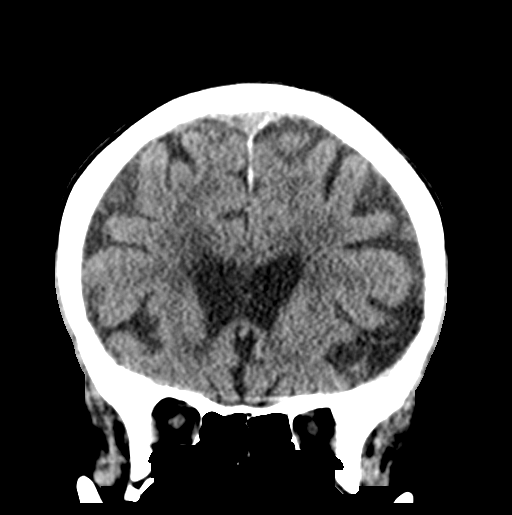
[im 33/73  brain]
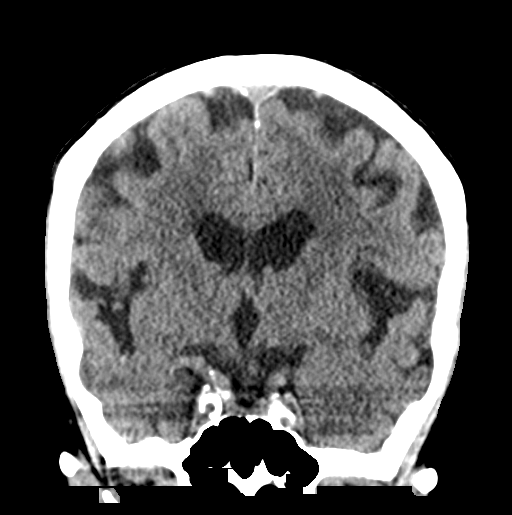
[im 41/73  brain]
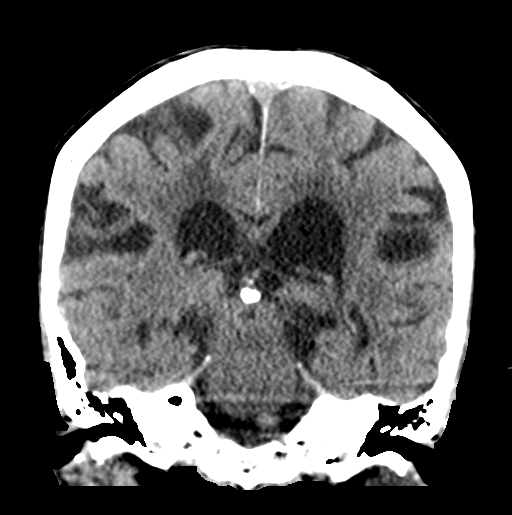

[Series 7: sagittal soft tissue · sagittal · 0.32mm/px · 3 of 55 slices shown]
[im 19/55  brain]
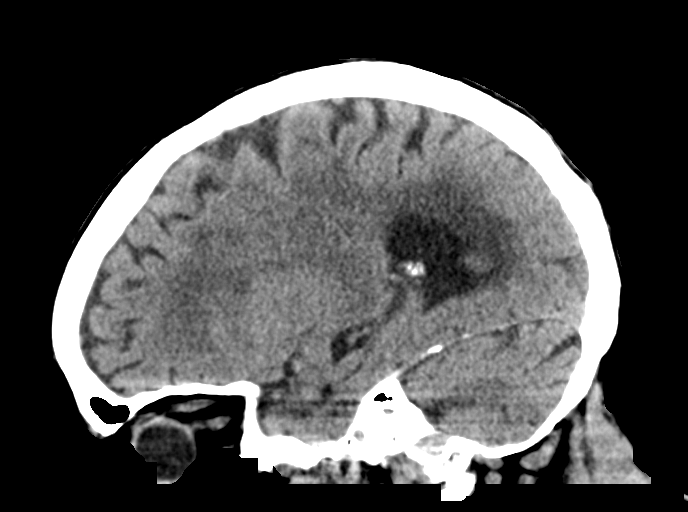
[im 28/55  brain]
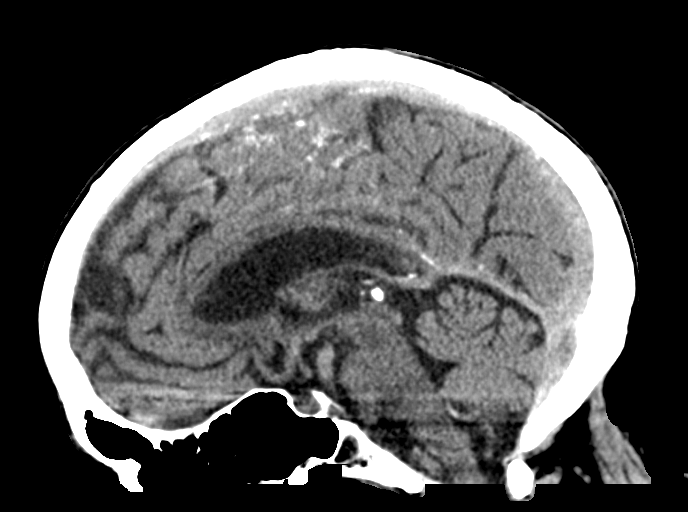
[im 37/55  brain]
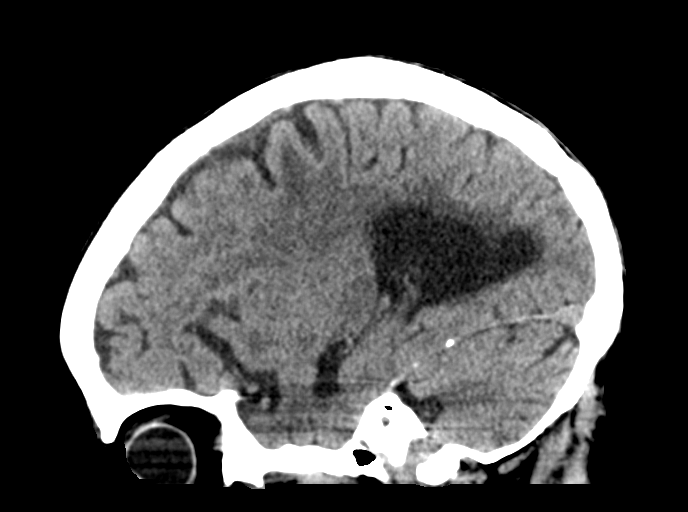

[15 of 47 positions shown; findings below may reference images not displayed]

FINDINGS: CT HEAD FINDINGS

Brain: No evidence of acute infarction, hemorrhage, hydrocephalus,
extra-axial collection or mass lesion/mass effect.

Global cortical atrophy. Subcortical white matter and
periventricular small vessel ischemic changes.

Vascular: Intracranial atherosclerosis.

Skull: Normal. Negative for fracture or focal lesion.

Sinuses/Orbits: The visualized paranasal sinuses are essentially
clear. The mastoid air cells are unopacified.

Other: None.

CT CERVICAL SPINE FINDINGS

Alignment: Normal cervical lordosis.

Skull base and vertebrae: No acute fracture. No primary bone lesion
or focal pathologic process.

Soft tissues and spinal canal: No prevertebral fluid or swelling. No
visible canal hematoma.

Disc levels: Mild multilevel degenerative changes. 3 mm
anterolisthesis C2 on C3. 3 mm retrolisthesis of C3 on C4. 5 mm
anterolisthesis of C5 on C6.

Upper chest: Visualized lung apices are notable for biapical
pleural-parenchymal scarring.

Other: Visualized thyroid is unremarkable.
IMPRESSION: No evidence of acute intracranial abnormality. Atrophy with small
vessel ischemic changes.

No evidence of traumatic injury to the cervical spine. Mild
multilevel degenerative changes.

## 2021-08-03 IMAGING — CT CT CERVICAL SPINE W/O CM
2 series · 9 of 27 positions shown, 11 images · non-contrast
Comparison: None.

CLINICAL DATA: Fall, left shoulder deformity



[Series 4: c spine soft · axial · 0.44mm/px · z∈[-222,-132]mm · 4 of 65 slices shown, 5 images]
[im 10/65  soft-tissue]
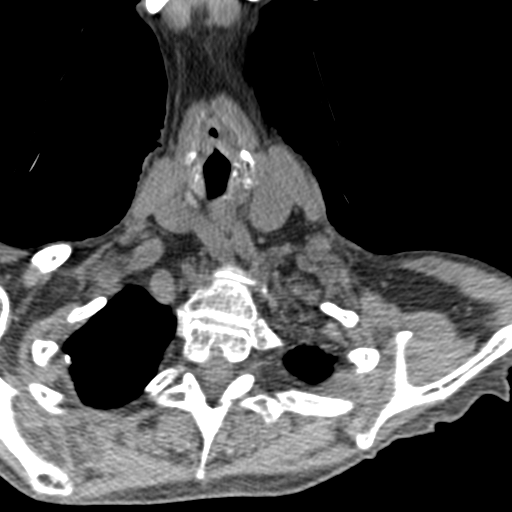
[im 10/65  bone]
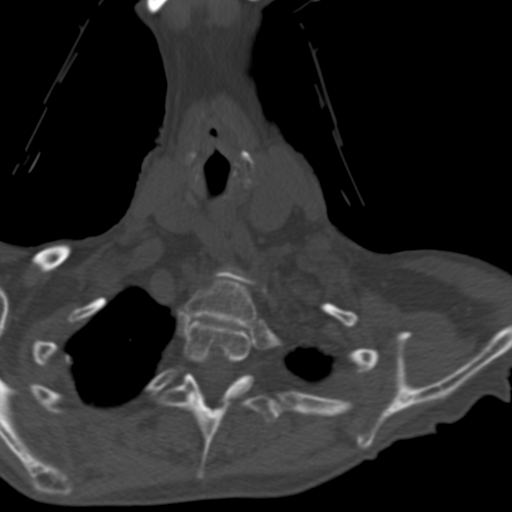
[im 25/65  bone]
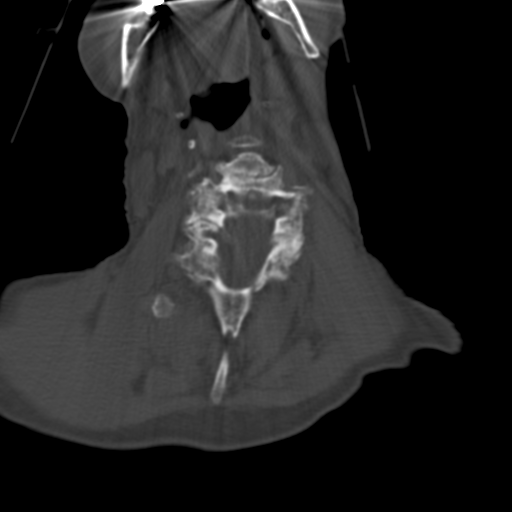
[im 40/65  bone]
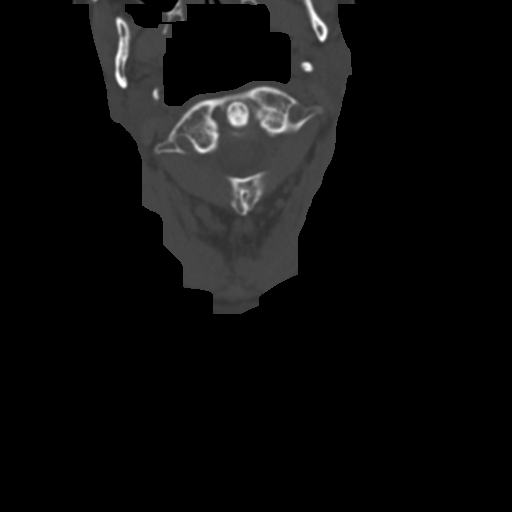
[im 55/65  bone]
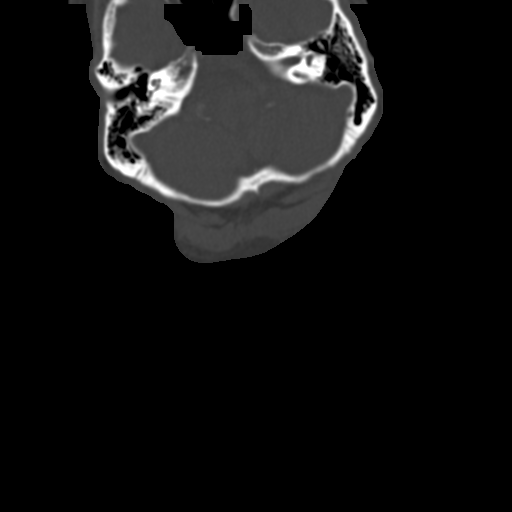

[Series 8: sagittal bone · sagittal · 0.23mm/px · 5 of 53 slices shown, 6 images]
[im 18/53  bone]
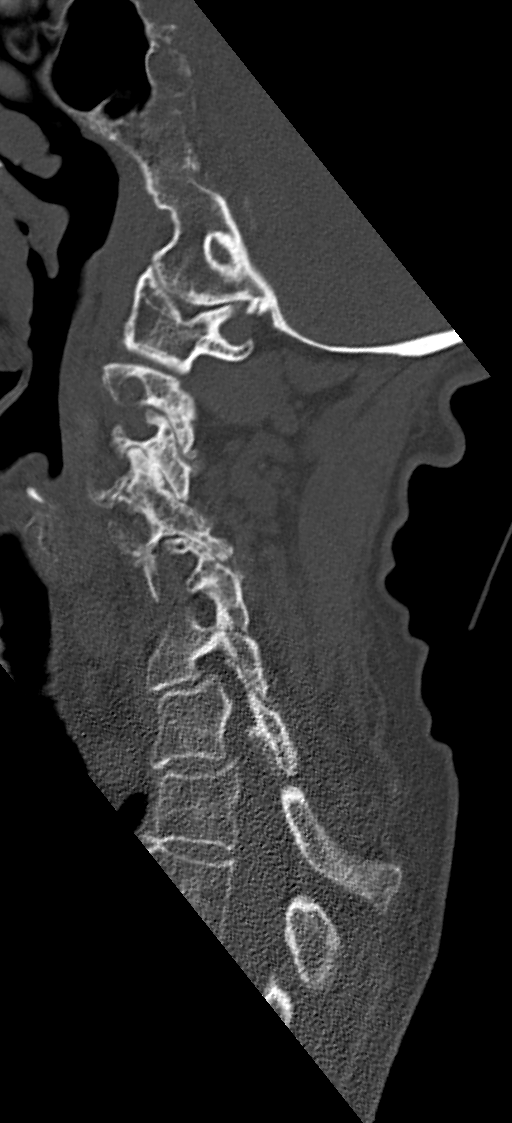
[im 22/53  bone]
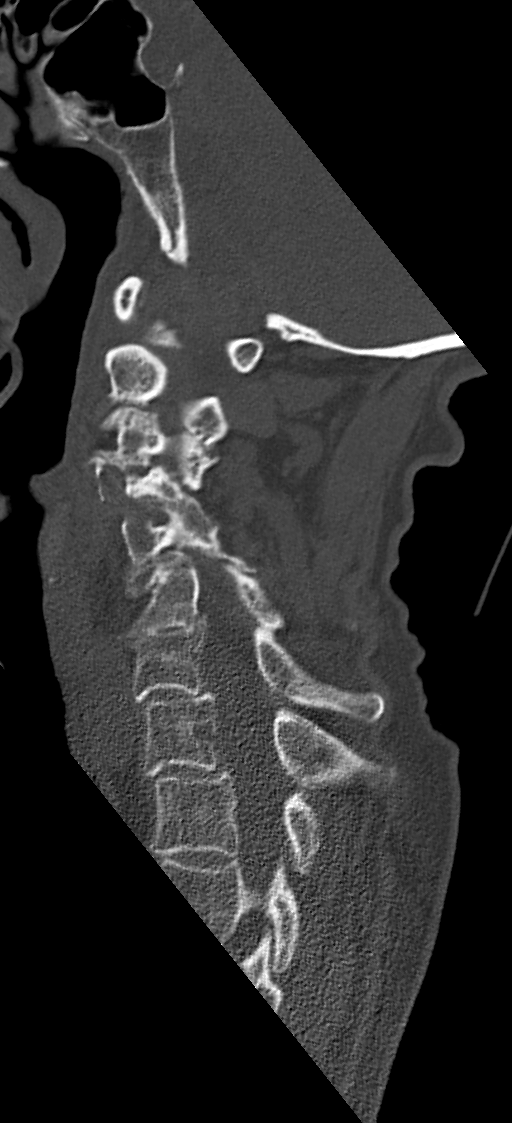
[im 27/53  soft-tissue]
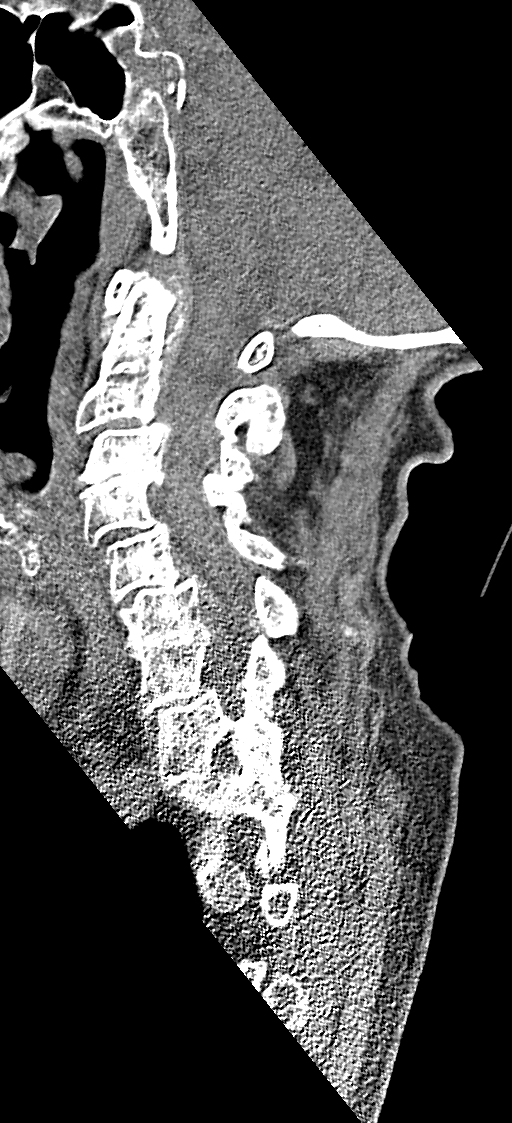
[im 27/53  bone]
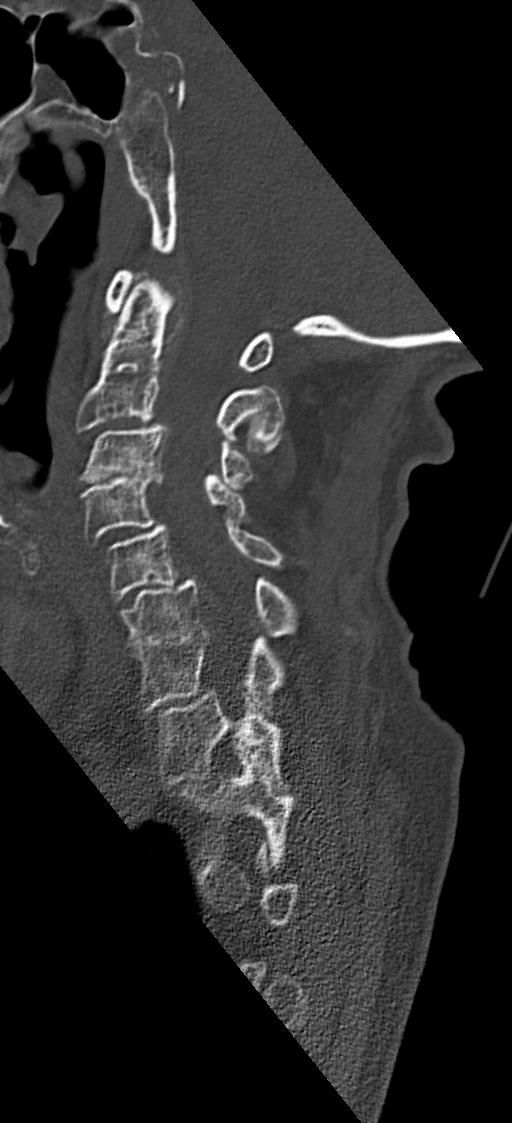
[im 31/53  bone]
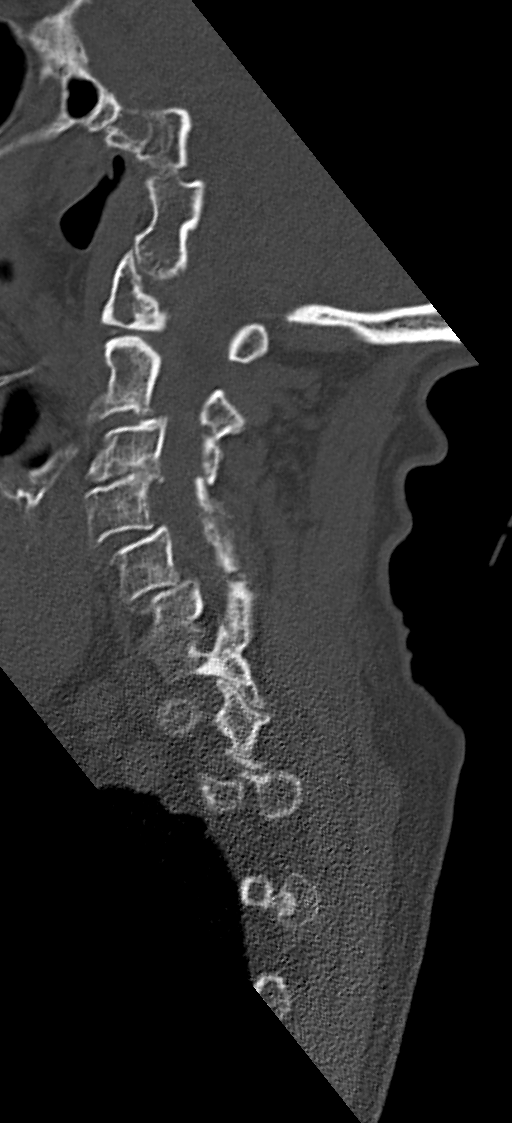
[im 35/53  bone]
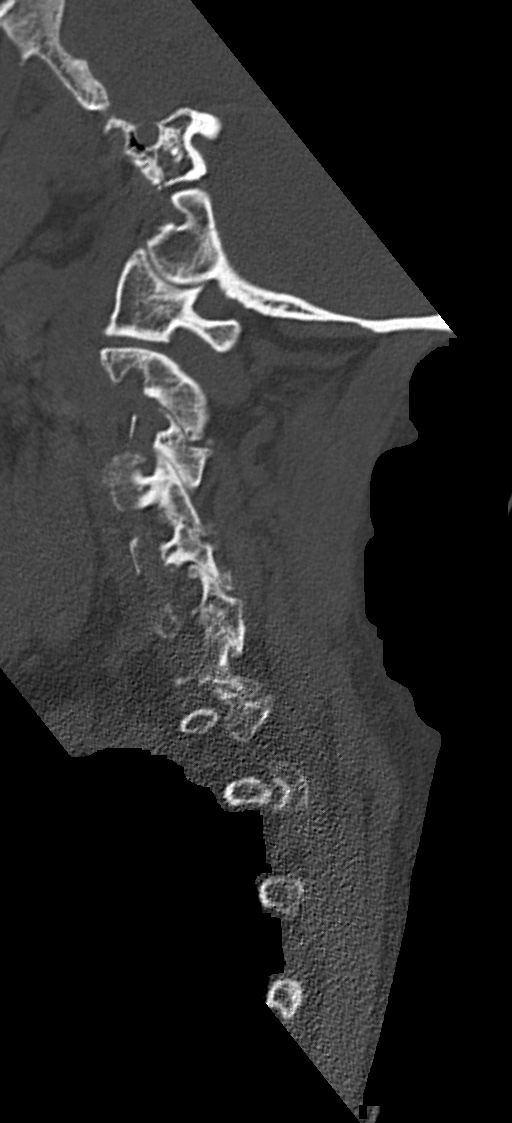

[9 of 27 positions shown; findings below may reference images not displayed]

FINDINGS: CT HEAD FINDINGS

Brain: No evidence of acute infarction, hemorrhage, hydrocephalus,
extra-axial collection or mass lesion/mass effect.

Global cortical atrophy. Subcortical white matter and
periventricular small vessel ischemic changes.

Vascular: Intracranial atherosclerosis.

Skull: Normal. Negative for fracture or focal lesion.

Sinuses/Orbits: The visualized paranasal sinuses are essentially
clear. The mastoid air cells are unopacified.

Other: None.

CT CERVICAL SPINE FINDINGS

Alignment: Normal cervical lordosis.

Skull base and vertebrae: No acute fracture. No primary bone lesion
or focal pathologic process.

Soft tissues and spinal canal: No prevertebral fluid or swelling. No
visible canal hematoma.

Disc levels: Mild multilevel degenerative changes. 3 mm
anterolisthesis C2 on C3. 3 mm retrolisthesis of C3 on C4. 5 mm
anterolisthesis of C5 on C6.

Upper chest: Visualized lung apices are notable for biapical
pleural-parenchymal scarring.

Other: Visualized thyroid is unremarkable.
IMPRESSION: No evidence of acute intracranial abnormality. Atrophy with small
vessel ischemic changes.

No evidence of traumatic injury to the cervical spine. Mild
multilevel degenerative changes.

## 2021-08-03 MED ORDER — ACETAMINOPHEN 500 MG PO TABS
1000.0000 mg | ORAL_TABLET | Freq: Once | ORAL | Status: AC
  Administered 2021-08-03: 1000 mg via ORAL
  Filled 2021-08-03: qty 2

## 2021-08-03 MED ORDER — SODIUM CHLORIDE 0.9 % IV BOLUS
1000.0000 mL | Freq: Once | INTRAVENOUS | Status: AC
  Administered 2021-08-03: 1000 mL via INTRAVENOUS

## 2021-08-03 MED ORDER — FENTANYL CITRATE PF 50 MCG/ML IJ SOSY: 12.5000 ug | PREFILLED_SYRINGE | Freq: Once | INTRAMUSCULAR | Status: DC

## 2021-08-03 MED ORDER — HYDROCODONE-ACETAMINOPHEN 7.5-325 MG/15ML PO SOLN: 1.0000 mL | Freq: Four times a day (QID) | ORAL | 0 refills | Status: AC | PRN

## 2021-08-03 NOTE — ED Notes (Signed)
Patient c/o pain to L shoulder s/p falling out of recliner chair.  Reports she had fallen asleep and remembers "dozing off."  Woke when she was "falling out of the chair."  Neck pain with hx of spinal stenosis and myelopathy.  No LOC or blood thinner use  ?

## 2021-08-03 NOTE — ED Triage Notes (Signed)
Patient BIB EMS from [facility] at [place]. Patient was sleeping in her recliner and fell asleep.  Remembers waking up and was "falling out of recliner."  No LOC.  Has deformity noted to L shoulder.  C-collar placed due to hx of spinal stenosis  ?

## 2021-08-03 NOTE — ED Notes (Signed)
Patient reports increased pain with movement.  Would like to wait a few minutes to allow Tylenol to help.  Son-in-law at bedside and will be taking patient home.  ?

## 2021-08-03 NOTE — ED Provider Notes (Signed)
?[facility]-EMERGENCY DEPT ?Provider Note ? ?CSN: [id number] ?Arrival date & time: 08/03/21 0142 ? ?Chief Complaint(s) ?Shoulder Injury ? ?HPI ?Jordan Nicholson is a 88 y.o. female with a past medical history listed below including atrial fibrillation and prior DVTs on Eliquis who presents to the emergency department with left shoulder pain status post a fall from sitting position.  She reports being in her living room watching TV, falling asleep and falling from her chair onto her left side.  Positive head trauma.  Reports that she awoke upon impact.  Felt immediate shoulder pain.  Also endorses neck pain and reports that she has cervical issues.  She reports that she had to crawl to the bathroom to pull the emergency cord.  Denies any chest pain or back pain.  No hip pain.  No other extremity pain. ? ?The history is provided by the patient.  ? ?Past Medical History ?Past Medical History:  ?Diagnosis Date  ? Anxiety   ? Atrial fibrillation (HCC)   ? Cervical myelopathy (HCC)   ? Chronic renal insufficiency   ? Depression   ? DVT (deep venous thrombosis) (HCC)   ? Left lower extremity  ? Fibromyalgia   ? Migraine   ? Myelopathy (HCC)   ? ?Patient Active Problem List  ? Diagnosis Date Noted  ? Leg swelling 11/08/2018  ? Chronic venous insufficiency 11/08/2018  ? ?Home Medication(s) ?Prior to Admission medications   ?Medication Sig Start Date End Date Taking? Authorizing Provider  ?HYDROcodone-acetaminophen (HYCET) 7.5-325 mg/15 ml solution Take 1-10 mLs by mouth every 6 (six) hours as needed for up to 7 days for moderate pain or severe pain. 08/03/21 08/10/21 Yes Ciin Brazzel, Amadeo Garnet, MD  ?Alpha Lipoic Acid 200 MG CAPS Take by mouth.    [provider]  ?ALPRAZolam (XANAX) 0.25 MG tablet TK 1 T PO D PRN 10/26/14   [provider]  ?apixaban (ELIQUIS) 5 MG TABS tablet TAKE 2 TABLETS TWICE A DAY FOR 7 DAYS THEN 1 TABLET TWICE DAILY THEREAFTER - REPLACES CLOPIDOGREL 11/27/14   [provider]  ?calcium-vitamin D (OSCAL WITH D) 500-200 MG-UNIT tablet Take 1 tablet by mouth.    [provider]  ?cephALEXin (KEFLEX) 500 MG capsule TAKE 1 CAPSULE BY MOUTH FOUR TIMES A DAY FOR 7 DAYS 03/16/18   [provider]  ?Cholecalciferol (VITAMIN D3 PO) Take by mouth. 2000 units    [provider]  ?co-enzyme Q-10 30 MG capsule Take 30 mg by mouth 3 (three) times daily.    [provider]  ?Cyanocobalamin (VITAMIN B 12 PO) Take by mouth.    [provider]  ?DEXILANT 60 MG capsule Take 1 capsule by mouth 2 (two) times daily. 03/08/18   [provider]  ?dicyclomine (BENTYL) 20 MG tablet Take by mouth.    [provider]  ?Docosahexaenoic Acid (DHA COMPLETE PO) Take by mouth.    [provider]  ?escitalopram (LEXAPRO) 5 MG tablet Take one tab PO qhs. 10/23/15   [provider]  ?estrogens, conjugated, (PREMARIN) 0.625 MG tablet Take by mouth.    [provider]  ?famotidine (PEPCID) 20 MG tablet TAKE 1 TABLET BY MOUTH TWICE A DAY AS DIRECTED 03/24/18   [provider]  ?hydrochlorothiazide (HYDRODIURIL) 25 MG tablet TAKE 1 TABLET(25 MG) BY MOUTH DAILY 02/16/17   [provider]  ?lidocaine (LIDODERM) 5 % APPLY 1 PATCH D 01/11/15   [provider]  ?memantine (NAMENDA) 5 MG tablet Take  1 tablet daily for one week, then take 1 tablet twice daily for one week, then take 1 tablet in the morning and 2 in the evening for one week, then take 2 tablets twice daily 10/18/18   [place] SpanielWillis, Charles K, MD  ?mometasone (ELOCON) 0.1 % cream APPLY TO AFFECTED AREA TWICE A DAY FOR 14 DAYS 04/06/18   [provider]  ?Probiotic Product (PROBIOTIC-10 PO) Take by mouth.    [provider]  ?ranitidine (ZANTAC) 150 MG tablet TAKE 1 TABLET BY MOUTH TWICE A DAY ONE HOUR BEFORE MEALS 02/15/18   [provider]  ?sucralfate (CARAFATE) 1 GM/10ML suspension TAKE 2 TEASPOONSFULL BY MOUTH TWICE DAILY  FOR ESPHEAGEAL SPASM 10/30/14   [provider]  ?topiramate (TOPAMAX) 25 MG tablet TAKE 6 TABLETS BY MOUTH AT BEDTIME AS NEEDED FOR HEADACHE PREVENTION 03/17/16   [provider]  ?                                                                                                                                  ?Allergies ?Codeine and Lactose ? ?Review of Systems ?Review of Systems ?As noted in HPI ? ?Physical Exam ?Vital Signs  ?I have reviewed the triage vital signs ?BP (!) 197/87 (BP Location: Right Arm)   Pulse 82   Temp 98.1 ?F (36.7 ?C) (Oral)   Resp 18   Ht 5' 1.5" (1.562 m)   Wt 45.8 kg   SpO2 97%   BMI 18.77 kg/m?  ? ?Physical Exam ?Constitutional:   ?   General: She is not in acute distress. ?   Appearance: She is well-developed. She is not diaphoretic.  ?   Interventions: Cervical collar in place.  ?HENT:  ?   Head: Normocephalic and atraumatic. No contusion.  ?   Right Ear: External ear normal.  ?   Left Ear: External ear normal.  ?   Nose: Nose normal.  ?Eyes:  ?   General: No scleral icterus.    ?   Right eye: No discharge.     ?   Left eye: No discharge.  ?   Conjunctiva/sclera: Conjunctivae normal.  ?   Pupils: Pupils are equal, round, and reactive to light.  ?Cardiovascular:  ?   Rate and Rhythm: Normal rate and regular rhythm.  ?   Pulses:     ?     Radial pulses are 2+ on the right side and 2+ on the left side.  ?     Dorsalis pedis pulses are 2+ on the right side and 2+ on the left side.  ?   Heart sounds: Normal heart sounds. No murmur heard. ?  No friction rub. No gallop.  ?Pulmonary:  ?   Effort: Pulmonary effort is normal. No respiratory distress.  ?   Breath sounds: Normal breath sounds. No stridor. No wheezing.  ?Abdominal:  ?   General: There is no distension.  ?  Palpations: Abdomen is soft.  ?   Tenderness: There is no abdominal tenderness.  ?Musculoskeletal:  ?   Left shoulder: Tenderness and bony tenderness present. Decreased range of motion. Normal strength.  Normal pulse.  ?   Left upper arm: Tenderness and bony tenderness present.  ?   Left elbow: No tenderness.  ?   Left forearm: No tenderness.  ?   Cervical back: Normal range of motion and neck supple. No bony tenderness.  ?   Thoracic back: No bony tenderness.  ?   Lumbar back: No bony tenderness.  ?   Comments: Clavicles stable. ?Chest stable to AP/Lat compression. ?Pelvis stable to Lat compression. ?No obvious extremity deformity. ?No chest or abdominal wall contusion.  ?Skin: ?   General: Skin is warm and dry.  ?   Findings: No erythema or rash.  ?Neurological:  ?   Mental Status: She is alert and oriented to person, place, and time.  ?   Comments: Moving all extremities  ? ? ?ED Results and Treatments ?Labs ?(all labs ordered are listed, but only abnormal results are displayed) ?Labs Reviewed  ?CBC WITH DIFFERENTIAL/PLATELET - Abnormal; Notable for the following components:  ?    Result Value  ? RBC 3.76 (*)   ? Monocytes Absolute 1.1 (*)   ? All other components within normal limits  ?BASIC METABOLIC PANEL - Abnormal; Notable for the following components:  ? Glucose, Bld 155 (*)   ? BUN 27 (*)   ? GFR, Estimated 57 (*)   ? All other components within normal limits  ?                                                                                                                       ?EKG ? EKG Interpretation ? ?Date/Time:  Saturday August 03 2021 03:54:51 EDT ?Ventricular Rate:  95 ?PR Interval:  161 ?QRS Duration: 96 ?QT Interval:  420 ?QTC Calculation: 460 ?R Axis:   -84 ?Text Interpretation: Sinus rhythm Supraventricular bigeminy Left anterior fascicular block Anterior infarct, old Baseline wander in lead(s) V1 Confirmed by Drema Pry [phone]) on 08/03/2021 3:59:48 AM ?  ? ?  ? ?Radiology ?CT Head Wo Contrast ? ?Result Date: 08/03/2021 ?CLINICAL DATA:  Fall, left shoulder deformity EXAM: CT HEAD WITHOUT CONTRAST CT CERVICAL SPINE WITHOUT CONTRAST TECHNIQUE: Multidetector CT imaging of the head and cervical  spine was performed following the standard protocol without intravenous contrast. Multiplanar CT image reconstructions of the cervical spine were also generated. RADIATION DOSE REDUCTION: This exam was performed accor

## 2021-08-03 NOTE — ED Notes (Addendum)
Patient assisted to use restroom with assistance of steady.  Patient discharged back to [facility] at [place] with son-in-law.  Mr. Jordan Nicholson will be assisting patient back to facility ?

## 2021-08-03 NOTE — Discharge Instructions (Addendum)
For pain control you may take at 1000 mg of Tylenol every 8 hours scheduled.  In addition you can take 1-10 mL of Hycet every 6 hours as needed for pain not controlled with the scheduled Tylenol. ? ?Patient will likely need sNF placement with rehab for at least 2 weeks. Coordinate with facility. ? ?

## 2021-08-03 NOTE — ED Notes (Signed)
Patient reports she is afraid of receiving pain medication and would like to hold off until she is having severe pain ?

## 2021-08-29 ENCOUNTER — Emergency Department (HOSPITAL_COMMUNITY): Payer: Medicare HMO

## 2021-08-29 ENCOUNTER — Inpatient Hospital Stay (HOSPITAL_COMMUNITY)
Admission: EM | Admit: 2021-08-29 | Discharge: 2021-09-01 | DRG: 305 | Disposition: A | Discharge status: Home / Self Care | Payer: Medicare HMO | Attending: Family Medicine | Admitting: Family Medicine

## 2021-08-29 ENCOUNTER — Encounter (HOSPITAL_COMMUNITY): Payer: Self-pay

## 2021-08-29 ENCOUNTER — Other Ambulatory Visit: Payer: Self-pay

## 2021-08-29 DIAGNOSIS — M4802 Spinal stenosis, cervical region: Secondary | ICD-10-CM | POA: Diagnosis present

## 2021-08-29 DIAGNOSIS — F419 Anxiety disorder, unspecified: Secondary | ICD-10-CM | POA: Diagnosis present

## 2021-08-29 DIAGNOSIS — Z885 Allergy status to narcotic agent status: Secondary | ICD-10-CM

## 2021-08-29 DIAGNOSIS — I159 Secondary hypertension, unspecified: Secondary | ICD-10-CM | POA: Diagnosis present

## 2021-08-29 DIAGNOSIS — I482 Chronic atrial fibrillation, unspecified: Secondary | ICD-10-CM | POA: Diagnosis present

## 2021-08-29 DIAGNOSIS — Z8673 Personal history of transient ischemic attack (TIA), and cerebral infarction without residual deficits: Secondary | ICD-10-CM

## 2021-08-29 DIAGNOSIS — D539 Nutritional anemia, unspecified: Secondary | ICD-10-CM | POA: Diagnosis present

## 2021-08-29 DIAGNOSIS — Z86718 Personal history of other venous thrombosis and embolism: Secondary | ICD-10-CM

## 2021-08-29 DIAGNOSIS — R296 Repeated falls: Secondary | ICD-10-CM | POA: Diagnosis present

## 2021-08-29 DIAGNOSIS — M47812 Spondylosis without myelopathy or radiculopathy, cervical region: Secondary | ICD-10-CM | POA: Diagnosis present

## 2021-08-29 DIAGNOSIS — Z807 Family history of other malignant neoplasms of lymphoid, hematopoietic and related tissues: Secondary | ICD-10-CM

## 2021-08-29 DIAGNOSIS — N179 Acute kidney failure, unspecified: Secondary | ICD-10-CM | POA: Diagnosis not present

## 2021-08-29 DIAGNOSIS — E860 Dehydration: Secondary | ICD-10-CM | POA: Diagnosis present

## 2021-08-29 DIAGNOSIS — I872 Venous insufficiency (chronic) (peripheral): Secondary | ICD-10-CM | POA: Diagnosis present

## 2021-08-29 DIAGNOSIS — I878 Other specified disorders of veins: Secondary | ICD-10-CM | POA: Diagnosis present

## 2021-08-29 DIAGNOSIS — G8929 Other chronic pain: Secondary | ICD-10-CM | POA: Diagnosis present

## 2021-08-29 DIAGNOSIS — Z91018 Allergy to other foods: Secondary | ICD-10-CM

## 2021-08-29 DIAGNOSIS — K219 Gastro-esophageal reflux disease without esophagitis: Secondary | ICD-10-CM | POA: Diagnosis present

## 2021-08-29 DIAGNOSIS — Z79899 Other long term (current) drug therapy: Secondary | ICD-10-CM

## 2021-08-29 DIAGNOSIS — I161 Hypertensive emergency: Principal | ICD-10-CM | POA: Diagnosis present

## 2021-08-29 DIAGNOSIS — Z8249 Family history of ischemic heart disease and other diseases of the circulatory system: Secondary | ICD-10-CM

## 2021-08-29 DIAGNOSIS — Z888 Allergy status to other drugs, medicaments and biological substances status: Secondary | ICD-10-CM

## 2021-08-29 DIAGNOSIS — E739 Lactose intolerance, unspecified: Secondary | ICD-10-CM | POA: Diagnosis present

## 2021-08-29 DIAGNOSIS — M7989 Other specified soft tissue disorders: Secondary | ICD-10-CM | POA: Diagnosis present

## 2021-08-29 DIAGNOSIS — Z7901 Long term (current) use of anticoagulants: Secondary | ICD-10-CM

## 2021-08-29 DIAGNOSIS — F32A Depression, unspecified: Secondary | ICD-10-CM | POA: Diagnosis present

## 2021-08-29 DIAGNOSIS — M797 Fibromyalgia: Secondary | ICD-10-CM | POA: Diagnosis present

## 2021-08-29 LAB — CBC
HCT: 32.2 % — ABNORMAL LOW (ref 36.0–46.0)
Hemoglobin: 11.7 g/dL — ABNORMAL LOW (ref 12.0–15.0)
MCH: 37 pg — ABNORMAL HIGH (ref 26.0–34.0)
MCHC: 36.3 g/dL — ABNORMAL HIGH (ref 30.0–36.0)
MCV: 101.9 fL — ABNORMAL HIGH (ref 80.0–100.0)
Platelets: 256 10*3/uL (ref 150–400)
RBC: 3.16 MIL/uL — ABNORMAL LOW (ref 3.87–5.11)
RDW: 16 % — ABNORMAL HIGH (ref 11.5–15.5)
WBC: 8.5 10*3/uL (ref 4.0–10.5)
nRBC: 0 % (ref 0.0–0.2)

## 2021-08-29 LAB — COMPREHENSIVE METABOLIC PANEL
ALT: 12 U/L (ref 0–44)
AST: 21 U/L (ref 15–41)
Albumin: 4 g/dL (ref 3.5–5.0)
Alkaline Phosphatase: 79 U/L (ref 38–126)
Anion gap: 10 (ref 5–15)
BUN: 22 mg/dL (ref 8–23)
CO2: 24 mmol/L (ref 22–32)
Calcium: 9.7 mg/dL (ref 8.9–10.3)
Chloride: 105 mmol/L (ref 98–111)
Creatinine, Ser: 0.88 mg/dL (ref 0.44–1.00)
GFR, Estimated: >60 mL/min (ref >60)
Glucose, Bld: 89 mg/dL (ref 70–99)
Potassium: 4 mmol/L (ref 3.5–5.1)
Sodium: 139 mmol/L (ref 135–145)
Total Bilirubin: 0.5 mg/dL (ref 0.3–1.2)
Total Protein: 7 g/dL (ref 6.5–8.1)

## 2021-08-29 LAB — DIFFERENTIAL
Abs Immature Granulocytes: 0.02 10*3/uL (ref 0.00–0.07)
Basophils Absolute: 0 10*3/uL (ref 0.0–0.1)
Basophils Relative: 0 %
Eosinophils Absolute: 0.1 10*3/uL (ref 0.0–0.5)
Eosinophils Relative: 1 %
Immature Granulocytes: 0 %
Lymphocytes Relative: 21 %
Lymphs Abs: 1.8 10*3/uL (ref 0.7–4.0)
Monocytes Absolute: 0.8 10*3/uL (ref 0.1–1.0)
Monocytes Relative: 10 %
Neutro Abs: 5.8 10*3/uL (ref 1.7–7.7)
Neutrophils Relative %: 68 %

## 2021-08-29 LAB — APTT: aPTT: 31 seconds (ref 24–36)

## 2021-08-29 LAB — PROTIME-INR
INR: 1.2 (ref 0.8–1.2)
Prothrombin Time: 14.8 seconds (ref 11.4–15.2)

## 2021-08-29 LAB — CBG MONITORING, ED: Glucose-Capillary: 88 mg/dL (ref 70–99)

## 2021-08-29 MED ORDER — SODIUM CHLORIDE 0.9% FLUSH: 3.0000 mL | Freq: Once | INTRAVENOUS | Status: DC

## 2021-08-29 NOTE — ED Provider Triage Note (Signed)
Emergency Medicine Provider Triage Evaluation Note ? ?Jordan Nicholson , a 88 y.o. female  was evaluated in triage.  Pt complains of dizziness, weakness, and near syncope today. Denies unilateral weakness. H/o TIA. She reports a headache starting around 1200 today.  Denies any blurry vision, chest pain, shortness of breath. ? ?Review of Systems  ?Positive:  ?Negative:  ? ?Physical Exam  ?BP (!) 207/85 (BP Location: Right Arm)   Pulse 94   Temp 98.9 ?F (37.2 ?C) (Oral)   Resp 16   Ht 5' 1.5" (1.562 m)   Wt 45.8 kg   SpO2 98%   BMI 18.77 kg/m?  ?Gen:   Awake, no distress   ?Resp:  Normal effort  ?MSK:   Moves extremities without difficulty  ?Other:  Cranial nerves II through XII intact.  Finger-nose intact.  Sensation intact.  Strength 5-5 in the upper and lower extremities.  Unable to assess pronator drift with this patient's left arm is in a sling. No facial droop. Answering questions appropriately with appropriate speech.  ? ?Medical Decision Making  ?Medically screening exam initiated at 5:06 PM.  Appropriate orders placed.  Jordan Nicholson was informed that the remainder of the evaluation will be completed by another provider, this initial triage assessment does not replace that evaluation, and the importance of remaining in the ED until their evaluation is complete. ? ?Will order labs and CT imaging. No focal deficit.  ?  ?Sherrell Puller, PA-C ?08/29/21 1711 ? ?

## 2021-08-29 NOTE — ED Triage Notes (Addendum)
Pt arrived via [facility] from the [employer] store where she was there w/daughter. Pt had a near syncopal episode. Got dizzy and weak and "flopped down on bench. Pt told EMS she woke up with a slight migraine.  Pt LKW 1200 ?

## 2021-08-30 ENCOUNTER — Observation Stay (HOSPITAL_COMMUNITY): Payer: Medicare HMO

## 2021-08-30 DIAGNOSIS — R55 Syncope and collapse: Secondary | ICD-10-CM

## 2021-08-30 DIAGNOSIS — I161 Hypertensive emergency: Secondary | ICD-10-CM | POA: Diagnosis present

## 2021-08-30 LAB — TROPONIN I (HIGH SENSITIVITY)
Troponin I (High Sensitivity): 31 ng/L — ABNORMAL HIGH (ref <18)
Troponin I (High Sensitivity): 28 ng/L — ABNORMAL HIGH (ref <18)

## 2021-08-30 LAB — ECHOCARDIOGRAM COMPLETE
AR max vel: 1.34 cm2
AV Area VTI: 1.42 cm2
AV Area mean vel: 1.19 cm2
AV Mean grad: 12.7 mmHg
AV Peak grad: 25.3 mmHg
Ao pk vel: 2.52 m/s
Area-P 1/2: 2.59 cm2
Height: 61.5 in
P 1/2 time: 680 msec
S' Lateral: 2.6 cm
Weight: 1615.53 oz

## 2021-08-30 MED ORDER — ACETAMINOPHEN 325 MG PO TABS
ORAL_TABLET | ORAL | Status: AC
  Administered 2021-08-30: 325 mg via ORAL
  Filled 2021-08-30: qty 2

## 2021-08-30 MED ORDER — ACETAMINOPHEN 325 MG PO TABS
650.0000 mg | ORAL_TABLET | Freq: Four times a day (QID) | ORAL | Status: DC | PRN
  Administered 2021-08-31 – 2021-09-01 (×4): 650 mg via ORAL
  Filled 2021-08-30 (×3): qty 2

## 2021-08-30 MED ORDER — HYDRALAZINE HCL 20 MG/ML IJ SOLN
10.0000 mg | Freq: Once | INTRAMUSCULAR | Status: DC
  Filled 2021-08-30: qty 1

## 2021-08-30 MED ORDER — APIXABAN 2.5 MG PO TABS
2.5000 mg | ORAL_TABLET | Freq: Two times a day (BID) | ORAL | Status: DC
  Administered 2021-08-30 – 2021-09-01 (×5): 2.5 mg via ORAL
  Filled 2021-08-30 (×5): qty 1

## 2021-08-30 MED ORDER — FAMOTIDINE 20 MG PO TABS
20.0000 mg | ORAL_TABLET | Freq: Every day | ORAL | Status: DC
  Administered 2021-08-30 – 2021-09-01 (×3): 20 mg via ORAL
  Filled 2021-08-30 (×3): qty 1

## 2021-08-30 MED ORDER — APIXABAN 5 MG PO TABS
5.0000 mg | ORAL_TABLET | Freq: Two times a day (BID) | ORAL | Status: DC
  Administered 2021-08-30: 5 mg via ORAL
  Filled 2021-08-30: qty 1

## 2021-08-30 MED ORDER — NITROGLYCERIN IN D5W 200-5 MCG/ML-% IV SOLN
0.0000 ug/min | INTRAVENOUS | Status: DC
  Administered 2021-08-30 – 2021-08-31 (×2): 5 ug/min via INTRAVENOUS
  Filled 2021-08-30: qty 250

## 2021-08-30 NOTE — ED Provider Notes (Addendum)
?[facility] ?Provider Note ? ? ?CSN: [id number] ?Arrival date & time: 08/29/21  1647 ? ?  ? ?History ? ?No chief complaint on file. ? ? ?Jordan Nicholson is a 88 y.o. female with past medical history significant for previous stroke, TIA, A-fib on chronic anticoagulation with Eliquis who presents with concern for dizziness, near syncopal episode, weakness that began yesterday at around noon.  Patient reports that she was at the [employer] store with her daughter when she got dizzy and flopped down on the bench.  She woke up with feeling of swimming had, and dull headache.  She reports that she had several other episodes of feeling this dizzy lightheaded feeling.  She reports that she has some esophageal issues and denies chest pain but does report some chest discomfort.  She denies any shortness of breath.  She endorses that she has had some vague abdominal fullness for the last few days.  She denies any acute visual deficits reports that she has been struggling with some double vision on and off for around a year.  She denies double vision at time of my exam.  She denies missing any doses of her Eliquis, any remaining headache, nausea, vomiting at this time.  She does not describe the dizzy sensation as a room spinning sensation, she denies any tinnitus.  No history of BPPV. ? ?HPI ? ?  ? ?Home Medications ?Prior to Admission medications   ?Medication Sig Start Date End Date Taking? Authorizing Provider  ?apixaban (ELIQUIS) 5 MG TABS tablet Take 5 mg by mouth 2 (two) times daily. 11/27/14  Yes [provider]  ?Biotin 10000 MCG TABS Take 10,000 mcg by mouth daily.   Yes [provider]  ?Coenzyme Q10 (COQ10) 100 MG CAPS Take 100 mg by mouth daily.   Yes [provider]  ?DEXILANT 60 MG capsule Take 1 capsule by mouth 2 (two) times daily. 03/08/18  Yes [provider]  ?dicyclomine (BENTYL) 20 MG tablet Take 20 mg by mouth daily as needed (IBS).   Yes  [provider]  ?estrogens, conjugated, (PREMARIN) 0.625 MG tablet Take 0.625 mg by mouth every other day.   Yes [provider]  ?famotidine (PEPCID) 20 MG tablet Take 20 mg by mouth daily. 03/24/18  Yes [provider]  ?Isometheptene-Dichloral-APAP (MIDRIN PO) Take 2 capsules by mouth daily as needed (migraine).   Yes [provider]  ?sucralfate (CARAFATE) 1 GM/10ML suspension Take 1 g by mouth daily as needed (IBS). 10/30/14  Yes [provider]  ?memantine (NAMENDA) 5 MG tablet Take 1 tablet daily for one week, then take 1 tablet twice daily for one week, then take 1 tablet in the morning and 2 in the evening for one week, then take 2 tablets twice daily ?Patient not taking: Reported on 08/30/2021 10/18/18   Kathrynn Ducking, MD  ?   ? ?Allergies    ?Codeine, Citrus, Coffee bean extract [coffea arabica], Cymbalta [duloxetine hcl], Elavil [amitriptyline], Kenalog [triamcinolone], Lactose, Lipitor [atorvastatin], Lyrica [pregabalin], Neurontin [gabapentin], Nexium [esomeprazole], Other, Pravachol [pravastatin], Prevacid [lansoprazole], Prilosec [omeprazole], Prolia [denosumab], Synthroid [levothyroxine], and Zocor [simvastatin]   ? ?Review of Systems   ?Review of Systems  ?Neurological:  Positive for dizziness, syncope and weakness.  ?All other systems reviewed and are negative. ? ?Physical Exam ?Updated Vital Signs ?BP (!) 180/90   Pulse 77   Temp 98 ?F (36.7 ?C) (Oral)   Resp 19   Ht 5' 1.5" (1.562 m)  Wt 45.8 kg   SpO2 98%   BMI 18.77 kg/m?  ?Physical Exam ?Vitals and nursing note reviewed.  ?Constitutional:   ?   General: She is not in acute distress. ?   Appearance: Normal appearance.  ?   Comments: Patient is overall well-appearing although elderly, diminutive  ?HENT:  ?   Head: Normocephalic and atraumatic.  ?Eyes:  ?   General:     ?   Right eye: No discharge.     ?   Left eye: No discharge.  ?Cardiovascular:  ?   Rate and Rhythm: Normal rate and regular  rhythm.  ?   Heart sounds: No murmur heard. ?  No friction rub. No gallop.  ?Pulmonary:  ?   Effort: Pulmonary effort is normal.  ?   Breath sounds: Normal breath sounds.  ?Abdominal:  ?   General: Bowel sounds are normal.  ?   Palpations: Abdomen is soft.  ?Skin: ?   General: Skin is warm and dry.  ?   Capillary Refill: Capillary refill takes less than 2 seconds.  ?Neurological:  ?   Mental Status: She is alert and oriented to person, place, and time.  ?   Comments: Cranial nerves II through XII grossly intact.  Intact finger-nose, intact heel-to-shin.  Alert and oriented x3.  Moves all 4 limbs spontaneously, normal coordination.  No pronator drift.  Intact strength 5 out of 5 bilateral upper and lower extremities --some increased pain secondary to recent fracture of the left upper arm. ? ?Romberg negative, gait normal.  Patient with no step outs during Romberg, and is able to take slow appropriate steps.  She endorses feeling quite weak during attempted ambulation. ? ?NIH: 0  ?Psychiatric:     ?   Mood and Affect: Mood normal.     ?   Behavior: Behavior normal.  ? ? ?ED Results / Procedures / Treatments   ?Labs ?(all labs ordered are listed, but only abnormal results are displayed) ?Labs Reviewed  ?CBC - Abnormal; Notable for the following components:  ?    Result Value  ? RBC 3.16 (*)   ? Hemoglobin 11.7 (*)   ? HCT 32.2 (*)   ? MCV 101.9 (*)   ? MCH 37.0 (*)   ? MCHC 36.3 (*)   ? RDW 16.0 (*)   ? All other components within normal limits  ?TROPONIN I (HIGH SENSITIVITY) - Abnormal; Notable for the following components:  ? Troponin I (High Sensitivity) 31 (*)   ? All other components within normal limits  ?PROTIME-INR  ?APTT  ?DIFFERENTIAL  ?COMPREHENSIVE METABOLIC PANEL  ?I-STAT CHEM 8, ED  ?CBG MONITORING, ED  ?TROPONIN I (HIGH SENSITIVITY)  ? ? ?EKG ?EKG Interpretation ? ?Date/Time:  Thursday August 29 2021 16:56:14 EDT ?Ventricular Rate:  92 ?PR Interval:  192 ?QRS Duration: 92 ?QT Interval:  356 ?QTC  Calculation: 440 ?R Axis:   -80 ?Text Interpretation: Sinus rhythm with Premature atrial complexes Left axis deviation Incomplete right bundle branch block Septal infarct , age undetermined Abnormal ECG When compared with ECG of 03-Aug-2021 03:54, PREVIOUS ECG IS PRESENT Confirmed by Campbell Stall ([id number]) on 0000000 10:01:44 AM ? ?Radiology ?CT HEAD WO CONTRAST ? ?Result Date: 08/29/2021 ?CLINICAL DATA:  Dizziness, non-specific EXAM: CT HEAD WITHOUT CONTRAST TECHNIQUE: Contiguous axial images were obtained from the base of the skull through the vertex without intravenous contrast. RADIATION DOSE REDUCTION: This exam was performed according to the departmental dose-optimization program which includes automated exposure control,  adjustment of the mA and/or kV according to patient size and/or use of iterative reconstruction technique. COMPARISON:  CT head August 03, 2021. FINDINGS: Brain: Remote infarct in the left frontal cortex, unchanged. Similar remote bilateral cerebellar lacunar infarcts. Similar moderate patchy white matter hypoattenuation, nonspecific but compatible with chronic microvascular ischemic disease. No evidence of acute large vascular territory infarct, acute hemorrhage, mass lesion, midline shift, or hydrocephalus. Similar cerebral atrophy. Vascular: No hyperdense vessel identified. Calcific intracranial atherosclerosis. Skull: No acute fracture. Sinuses/Orbits: Clear visualized sinuses. No acute orbital findings. Other: No mastoid effusions. IMPRESSION: 1. No evidence of acute intracranial abnormality. 2. Similar remote infarcts and chronic microvascular ischemic disease. 3.  Cerebral atrophy (ICD10-G31.9). Electronically Signed   By: Margaretha Sheffield M.D.   On: 08/29/2021 20:02   ? ?Procedures ?Procedures  ? ? ?Medications Ordered in ED ?Medications  ?sodium chloride flush (NS) 0.9 % injection 3 mL (has no administration in time range)  ?nitroGLYCERIN 50 mg in dextrose 5 % 250 mL (0.2 mg/mL)  infusion (10 mcg/min Intravenous Rate/Dose Change 08/30/21 1043)  ?apixaban (ELIQUIS) tablet 5 mg (5 mg Oral Given 08/30/21 1041)  ?famotidine (PEPCID) tablet 20 mg (20 mg Oral Given 08/30/21 1041)  ? ? ?ED Course/ Medical Dec[facility]

## 2021-08-30 NOTE — Hospital Course (Addendum)
Jordan Nicholson is a 88 y.o.female with a history of TIA, stroke, Afib, GERD, LE venous insufficiency, esophageal surgery who was admitted to the [place] [facility] for near syncope and hypertensive emergency. Her hospital course is detailed below: ? ?Near Syncope 2/2 Hypertensive Emergency  ?Patient presented to the emergency room with an episode of near syncope and weakness.  Blood pressure 207/85, troponins 31 and 28, EKG with nonspecific changes.  CT of the head showed no acute changes with remote infarcts. Echo eith EF 50-60%, mild LVH and grade 1 diastolic dysfunction.  Cardiology was consulted and started the patient on a nitro drip.  Blood pressures improved with nitro drip (discontinued on day 2) and patient was started on coreg without adverse effects. Discharged on 6.25 mg Coreg BID. She did not have any recurrent episodes of lightheadedness or dizziness. Worked with PT/OT during hospitalization and felt safe to return to ILF with continued therapy there. Blood pressures at time of discharge were ranging 150s/60s.  ? ?A-fib  ?Rate controlled throughout hospitalization.  Continued Eliquis 2.5 Mg twice daily given age and weight. Discharged with coreg as well for rate control and for her BP as noted above.  ? ?Non-Oliguric Pre-renal AKI ?Creatinine increased from 0.88>1.23 overnight. Thought to be due to hypertensive emergency vs possible dehydration. This improved by the following day with encouraged PO intake. Creatinine 1.02 at discharge.  ? ?Cervical Stenosis  Cervical spondylosis  ?Patient reported of a history of cervical spine complications, that she has been seen for in the past. While inpatient, we ordered a c-spine MRI to ensure no cord impingement. Cervical MRI showed moderately advanced multilevel cervical spondylosis with spinal stenosis at C2-3 and C3-4 multiple degenerative changes with bilateral C3 foraminal stenosis, severe left with moderate right C4 foraminal narrowing,  moderate left C6 foraminal stenosis, without any frank cord impingement.  Findings likely in the setting of advanced age.  Reassuringly, no evidence of cord impingement.  Follow-up outpatient. ? ?Other chronic conditions were medically managed with home medications and formulary alternatives as necessary (chronic venous stasis, GERD) ? ?PCP Follow-up Recommendations: ?Blood pressure check; adjust medications as needed  ?Changed Eliquis dosing to 2.5 mg BID given her age and weight ?Macrocytic anemia. Folate normal and B12 elevated during hospitalization, monitor and consider shared decision making with patient to further work-up given her age.  ?Aldosterone and renin pending at time of discharge ?F/u cervical MRI results outpt  ?

## 2021-08-30 NOTE — Progress Notes (Signed)
FPTS Brief Progress Note ? ?S: Patient seen on evening rounds. Feels quite well at this time. Has no acute complaints. No CP or palpitations at this time.  ? ? ?O: ?BP (!) 148/75 (BP Location: Right Arm)   Pulse 95   Temp 98.5 ?F (36.9 ?C) (Oral)   Resp 16   Ht 5' 1.5" (1.562 m)   Wt 45.8 kg   SpO2 95%   BMI 18.77 kg/m?   ?Gen: Awake, in good spirits, very pleasant ?CV: Irregularly irregular, II/VI systolic murmur ?Resp: Normal WOB on RA ?Neuro: Tone appropriate, no focal deficits ? ?A/P: ?Presyncope  Palpitations ?Patient remains on tele. Telemetry reviewed, no evidence of arrhythmia. Echo reassuring. ?- TSH added to am labs ?- Will need PT/OT eval in am ?- Awaiting cardiology recs ? ?Hypertensive Emergency ?BP much improved on nitro gtt. Last BP 164/55. Nitro gtt at low rate, only 19mcg/min.  ?- Will leave on nitro gtt overnight. Goal BP for first 24 hrs [id number] systolic.  ?- Plan to transition to oral BP agents in am ? ?- Orders reviewed. Labs for AM ordered, which was adjusted as needed.  ?- If condition changes, plan includes modifications to nitro gtt, consult to cardiology.  ? ?Eppie Gibson, MD ?08/30/2021, 9:37 PM ?PGY-1, [facility] Night Resident  ?Please page [phone] with questions.  ? ? ?

## 2021-08-30 NOTE — Plan of Care (Signed)

## 2021-08-30 NOTE — H&P (Signed)
Family Medicine Teaching Service ?Hospital Admission History and Physical ?Service Pager: [phone] ? ?Patient name: Jordan Nicholson Medical record number: [id number] ?Date of birth: January 10, 1927 Age: 88 y.o. Gender: female ? ?Primary Care Provider: Janie Morning, DO ?Consultants: Cards ?Code Status: FULL ?Preferred Emergency Contact: Jordan Nicholson (Daughter) [phone] ? ?Chief Complaint: Syncope ? ?Assessment and Plan: ?Alizeah Quintiliani is a 88 y.o. female presenting with near syncope and weakness. PMH is significant for TIA, stroke, Afib, GERD, LE venous insufficieny, esophageal surgery.  ? ?Syncope, chest discomfort ?ED work up s/f: Initial BP 207/85, trop 31>28, EKG w/ non specific changes from last. CT head with remote infarcts; no acute changes. Cardiology was consulted and the patient was started on a nitro gtt. Exam grossly unremarkable and patient appears well. Otherwise has a systolic murmur at sternal border bilaterally. ?DDX: ACS rule out, acute stroke versus TIA, valvular dysfunction, hypertension with component of orthostasis ?Plan: ?- Admit to FPTS, progressive, attending Dr. Thompson Grayer ?- Cardiology consulted, appreciate recommendations ?- Echo ?- A.m. EKG ?- A.m. BMP ?- Nitro gtt, consider PO medication this evening ?- Eliquis 5 mg twice daily ?- Continuous cardiac monitor ?- Orthostatic vital signs ? ?Hypertensive emergency ?Initial BP 207/85 with near syncope and chest discomfort.  CMP within normal limits.  Most recently 165/67. ?- Continue nitro drip and consider p.o. medication this evening ?- Appreciate cardiology recommendations ?- A.m. BMP ? ?Afib ?Heart rate well controlled 80s.  Of note some 40s overnight in ED.  INR 1.2. ?- Continuous cardiac telemetry ?- Eliquis 5 mg twice daily ?- A.m. EKG ? ?GERD ?- Continue home Dexilant (daughter to bring in) ?- Pepcid 20 mg daily ? ?Chronic venous stasis changes ?- Monitor for infection ?- SCDs ? ?Weakness ?- PT/OT eval ? ?FEN/GI: Dexilant, Pepcid, heart  healthy ?Prophylaxis: Eliquis ? ?Disposition: Progressive, OBS ? ?History of Present Illness:  Jordan Nicholson is a 88 y.o. female presenting with dizziness, near syncope, and weakness. She was in the [employer] store with her daughter when she felt as if her legs were going to give out. Her daughter was able to get her to sit down. Her daughter states she began shaking and was upset and appeared to have a panic attack. She kept voicing that she felt as if she was falling when she was sitting down. EMS was called and she was found to be very hypertensive so she was brought to the ED. She denies taking medication for her blood pressure and states her blood pressure has not been a problem.  ?She denies headache, change in vision, chest pain. She does endorse abdominal fullness and some discomfort. Otherwise she feels well.  ?The patient endorses a history of feeling as if she is going to fall and her legs give out. She has had several falls due to to this. Today however she did not fall.  ? ?Review Of Systems: Per HPI with the following additions: ? ?Review of Systems  ?Constitutional:  Negative for activity change, appetite change, chills and fever.  ?Respiratory:  Negative for shortness of breath.   ?Gastrointestinal:  Positive for abdominal distention. Negative for abdominal pain.  ?Musculoskeletal:  Positive for neck pain. Negative for joint swelling.  ?Skin:  Positive for rash and wound.  ?Neurological:  Positive for dizziness, syncope and weakness. Negative for light-headedness and headaches.  ?Psychiatric/Behavioral:  The patient is nervous/anxious.    ? ?Patient Active Problem List  ? Diagnosis Date Noted  ? Leg swelling 11/08/2018  ? Chronic venous insufficiency 11/08/2018  ? ? ?  Past Medical History: ?Past Medical History:  ?Diagnosis Date  ? Anxiety   ? Atrial fibrillation ([place])   ? Cervical myelopathy ([place])   ? Chronic renal insufficiency   ? Depression   ? DVT (deep venous thrombosis) ([place])   ? Left lower extremity   ? Fibromyalgia   ? Migraine   ? Myelopathy ([place])   ? ? ?Past Surgical History: ?Past Surgical History:  ?Procedure Laterality Date  ? ESOPHAGUS SURGERY    ? ? ?Social History: ?Social History  ? ?Tobacco Use  ? Smoking status: Never  ? Smokeless tobacco: Never  ?Vaping Use  ? Vaping Use: Never used  ?Substance Use Topics  ? Alcohol use: Never  ? Drug use: Never  ? ?Additional social history: Unsure of living situation, will need to assess during admission but appears to be independent.   ?Please also refer to relevant sections of EMR. ? ?Family History: ?Family History  ?Problem Relation Age of Onset  ? Non-Hodgkin's lymphoma Mother   ? Dementia Father   ? Cancer - Lung Sister   ? Heart disease Sister   ? ? ?Allergies and Medications: ?Allergies  ?Allergen Reactions  ? Codeine Nausea Only  ? Citrus Other (See Comments)  ?  Acid reflux  ? Coffee Bean Extract [Coffea Arabica] Other (See Comments)  ?  Esophageal spasm  ? Cymbalta [Duloxetine Hcl] Other (See Comments)  ?  Esophagitis  ? Elavil [Amitriptyline] Other (See Comments)  ?  Pt does not remember reaction  ? Kenalog [Triamcinolone] Hives  ? Lactose Diarrhea  ?  Lactose intolerant  ? Lipitor [Atorvastatin] Other (See Comments)  ?  Myalgia  ? Lyrica [Pregabalin] Other (See Comments)  ?  Esophagitis  ? Neurontin [Gabapentin] Other (See Comments)  ?  GI burning, acid reflux, diarrhea  ? Nexium [Esomeprazole] Other (See Comments)  ?  Pt does not remember reaction  ? Other Other (See Comments)  ?  Chili Peppers - causes gastritis ?Cucumber - causes hives  ? Pravachol [Pravastatin] Other (See Comments)  ?  myalgia  ? Prevacid [Lansoprazole] Other (See Comments)  ?  Pt does not remember reaction  ? Prilosec [Omeprazole] Other (See Comments)  ?  Pt does not remember reaction  ? Prolia [Denosumab] Other (See Comments)  ?  Jaw pain  ? Synthroid [Levothyroxine] Other (See Comments)  ?  Pt does not remember reaction  ? Zocor [Simvastatin] Other (See Comments)  ?  Myalgia   ? ?No current facility-administered medications on file prior to encounter.  ? ?Current Outpatient Medications on File Prior to Encounter  ?Medication Sig Dispense Refill  ? apixaban (ELIQUIS) 5 MG TABS tablet Take 5 mg by mouth 2 (two) times daily.    ? Biotin 10000 MCG TABS Take 10,000 mcg by mouth daily.    ? Coenzyme Q10 (COQ10) 100 MG CAPS Take 100 mg by mouth daily.    ? DEXILANT 60 MG capsule Take 1 capsule by mouth 2 (two) times daily.  3  ? dicyclomine (BENTYL) 20 MG tablet Take 20 mg by mouth daily as needed (IBS).    ? estrogens, conjugated, (PREMARIN) 0.625 MG tablet Take 0.625 mg by mouth every other day.    ? famotidine (PEPCID) 20 MG tablet Take 20 mg by mouth daily.  5  ? Isometheptene-Dichloral-APAP (MIDRIN PO) Take 2 capsules by mouth daily as needed (migraine).    ? sucralfate (CARAFATE) 1 GM/10ML suspension Take 1 g by mouth daily as needed (IBS).    ?  memantine (NAMENDA) 5 MG tablet Take 1 tablet daily for one week, then take 1 tablet twice daily for one week, then take 1 tablet in the morning and 2 in the evening for one week, then take 2 tablets twice daily (Patient not taking: Reported on 08/30/2021) 70 tablet 0  ? ? ?Objective: ?BP (!) 161/67   Pulse 75   Temp 98 ?F (36.7 ?C) (Oral)   Resp (!) 27   Ht 5' 1.5" (1.562 m)   Wt 45.8 kg   SpO2 98%   BMI 18.77 kg/m?  ?Exam: ?General: Appears well, sitting up in bed with daughter at bedside ?Eyes: White sclera. EOMI.  ?ENTM: Unremarkable.  ?Neck: supple, no obvious lesions ?Cardiovascular: Regularly irregular rhythm. Blowing systolic murmur heard best at sternal border bilaterally ?Respiratory: CTAB, normal effort on RA ?Gastrointestinal: Mild discomfort with palpation.  ?MSK: Can move all extremities appropriately. Limited ROM of LUE due to shoulder issues.  ?Derm: BLE chronic venous stasis changes, no open wounds ?Neuro: A&O x3. No obvious neurological deficits  ?Psych: normal mood and affect  ? ?Labs and Imaging: ?CBC BMET  ?Recent Labs   ?Lab 08/29/21 ?1721  ?WBC 8.5  ?HGB 11.7*  ?HCT 32.2*  ?PLT 256  ? Recent Labs  ?Lab 08/29/21 ?1721  ?NA 139  ?K 4.0  ?CL 105  ?CO2 24  ?BUN 22  ?CREATININE 0.88  ?GLUCOSE 89  ?CALCIUM 9.7  ?  ? ?EKG: My own interpretation (

## 2021-08-30 NOTE — Progress Notes (Shared)
Family Medicine Teaching Service ?Daily Progress Note ?Intern Pager: [phone] ? ?Patient name: Jordan Nicholson Medical record number: [id number] ?Date of birth: 1926-10-23 Age: 88 y.o. Gender: female ? ?Primary Care Provider: Irena Reichmann, DO ?Consultants: Cardiology ?Code Status: Full ? ?Pt Overview and Major Events to Date:  ?4/14- admitted ? ?Assessment and Plan: ?Jordan Nicholson is a 88yo female who presented with near-syncope and palpitations. PMH significant for TIA, stroke, A Fib, GERD, LE venous insufficiency, and hx of esophageal surgery.  ? ?Presyncope, Palpitations ?Echo yesterday with normal EF and G1DDF. No evidence of arrhythmia on telemetry overnight. AM EKG with ***. Labs this morning ***. TSH ***. Orthostatics ***.  ?- Follow-up cardiology recommendations. ?- Wean nitro gtt ?- Continue home Eliquis ?- Continue cardiac monitors ?- PT/OT to see  ? ?Hypertensive Emergency ?BP overnight 140s-***/70***. Much improved from 220s systolic on admission. Nitro gtt at 10 mcg/min overnight. ?- Can initiate Losartan 50mg  daily ?- Monitor Cr on am BMP ?- Wean nitro gtt ? ?Permanent Atrial Fibrillation, chronic, rate-controlled ?HR *** overnight.  ?- On Tele ?- Continue home Eliquis ? ?GERD ?- Continue home Dexilant ?- Pepcid 20mg  daily ? ? ?FEN/GI: Heart healthy diet ?PPx: on eliquis ?Dispo:Pending PT recommendations  tomorrow. Barriers include ongoing evaluation, cardiology assessment, wean off nitro gtt.  ? ?Subjective:  ?*** ? ?Objective: ?Temp:  [97.7 ?F (36.5 ?C)-98.5 ?F (36.9 ?C)] 98.5 ?F (36.9 ?C) (04/14 1927) ?Pulse Rate:  [39-95] 95 (04/14 1927) ?Resp:  [14-30] 16 (04/14 1927) ?BP: (139-225)/(50-93) 148/75 (04/14 1927) ?SpO2:  [95 %-99 %] 95 % (04/14 1927) ?Physical Exam: ?General: *** ?Cardiovascular: *** ?Respiratory: *** ?Abdomen: *** ?Extremities: *** ? ?Laboratory: ?Recent Labs  ?Lab 08/29/21 ?1721  ?WBC 8.5  ?HGB 11.7*  ?HCT 32.2*  ?PLT 256  ? ?Recent Labs  ?Lab 08/29/21 ?1721  ?NA 139  ?K 4.0  ?CL 105  ?CO2  24  ?BUN 22  ?CREATININE 0.88  ?CALCIUM 9.7  ?PROT 7.0  ?BILITOT 0.5  ?ALKPHOS 79  ?ALT 12  ?AST 21  ?GLUCOSE 89  ? ? ?*** ? ?Imaging/Diagnostic Tests: ?*** ? ?08/31/21, MD ?08/30/2021, 9:05 PM ?PGY-1, [facility] ?FPTS Intern pager: [phone], text pages welcome ? ?

## 2021-08-31 DIAGNOSIS — Z79899 Other long term (current) drug therapy: Secondary | ICD-10-CM | POA: Diagnosis not present

## 2021-08-31 DIAGNOSIS — K219 Gastro-esophageal reflux disease without esophagitis: Secondary | ICD-10-CM | POA: Diagnosis present

## 2021-08-31 DIAGNOSIS — I878 Other specified disorders of veins: Secondary | ICD-10-CM | POA: Diagnosis present

## 2021-08-31 DIAGNOSIS — M4802 Spinal stenosis, cervical region: Secondary | ICD-10-CM | POA: Diagnosis present

## 2021-08-31 DIAGNOSIS — Z885 Allergy status to narcotic agent status: Secondary | ICD-10-CM | POA: Diagnosis not present

## 2021-08-31 DIAGNOSIS — N179 Acute kidney failure, unspecified: Secondary | ICD-10-CM | POA: Diagnosis present

## 2021-08-31 DIAGNOSIS — M47812 Spondylosis without myelopathy or radiculopathy, cervical region: Secondary | ICD-10-CM | POA: Diagnosis present

## 2021-08-31 DIAGNOSIS — E739 Lactose intolerance, unspecified: Secondary | ICD-10-CM | POA: Diagnosis present

## 2021-08-31 DIAGNOSIS — E860 Dehydration: Secondary | ICD-10-CM | POA: Diagnosis present

## 2021-08-31 DIAGNOSIS — Z8673 Personal history of transient ischemic attack (TIA), and cerebral infarction without residual deficits: Secondary | ICD-10-CM | POA: Diagnosis not present

## 2021-08-31 DIAGNOSIS — R296 Repeated falls: Secondary | ICD-10-CM | POA: Diagnosis present

## 2021-08-31 DIAGNOSIS — M797 Fibromyalgia: Secondary | ICD-10-CM | POA: Diagnosis present

## 2021-08-31 DIAGNOSIS — Z86718 Personal history of other venous thrombosis and embolism: Secondary | ICD-10-CM | POA: Diagnosis not present

## 2021-08-31 DIAGNOSIS — Z7901 Long term (current) use of anticoagulants: Secondary | ICD-10-CM | POA: Diagnosis not present

## 2021-08-31 DIAGNOSIS — I159 Secondary hypertension, unspecified: Secondary | ICD-10-CM | POA: Diagnosis present

## 2021-08-31 DIAGNOSIS — I482 Chronic atrial fibrillation, unspecified: Secondary | ICD-10-CM | POA: Diagnosis present

## 2021-08-31 DIAGNOSIS — I161 Hypertensive emergency: Principal | ICD-10-CM

## 2021-08-31 DIAGNOSIS — G8929 Other chronic pain: Secondary | ICD-10-CM | POA: Diagnosis present

## 2021-08-31 DIAGNOSIS — I872 Venous insufficiency (chronic) (peripheral): Secondary | ICD-10-CM | POA: Diagnosis present

## 2021-08-31 DIAGNOSIS — Z91018 Allergy to other foods: Secondary | ICD-10-CM | POA: Diagnosis not present

## 2021-08-31 DIAGNOSIS — Z888 Allergy status to other drugs, medicaments and biological substances status: Secondary | ICD-10-CM | POA: Diagnosis not present

## 2021-08-31 DIAGNOSIS — F32A Depression, unspecified: Secondary | ICD-10-CM | POA: Diagnosis present

## 2021-08-31 DIAGNOSIS — D539 Nutritional anemia, unspecified: Secondary | ICD-10-CM | POA: Diagnosis present

## 2021-08-31 DIAGNOSIS — M7989 Other specified soft tissue disorders: Secondary | ICD-10-CM | POA: Diagnosis present

## 2021-08-31 LAB — TSH: TSH: 3.452 u[IU]/mL (ref 0.350–4.500)

## 2021-08-31 LAB — BASIC METABOLIC PANEL
Anion gap: 7 (ref 5–15)
BUN: 27 mg/dL — ABNORMAL HIGH (ref 8–23)
CO2: 24 mmol/L (ref 22–32)
Calcium: 8.7 mg/dL — ABNORMAL LOW (ref 8.9–10.3)
Chloride: 110 mmol/L (ref 98–111)
Creatinine, Ser: 1.23 mg/dL — ABNORMAL HIGH (ref 0.44–1.00)
GFR, Estimated: 41 mL/min — ABNORMAL LOW (ref >60)
Glucose, Bld: 136 mg/dL — ABNORMAL HIGH (ref 70–99)
Potassium: 4.7 mmol/L (ref 3.5–5.1)
Sodium: 141 mmol/L (ref 135–145)

## 2021-08-31 LAB — IRON AND TIBC
Iron: 72 ug/dL (ref 28–170)
Saturation Ratios: 18 % (ref 10.4–31.8)
TIBC: 400 ug/dL (ref 250–450)
UIBC: 328 ug/dL

## 2021-08-31 LAB — CBC
HCT: 28.8 % — ABNORMAL LOW (ref 36.0–46.0)
Hemoglobin: 10.5 g/dL — ABNORMAL LOW (ref 12.0–15.0)
MCH: 37.8 pg — ABNORMAL HIGH (ref 26.0–34.0)
MCHC: 36.5 g/dL — ABNORMAL HIGH (ref 30.0–36.0)
MCV: 103.6 fL — ABNORMAL HIGH (ref 80.0–100.0)
Platelets: 231 10*3/uL (ref 150–400)
RBC: 2.78 MIL/uL — ABNORMAL LOW (ref 3.87–5.11)
RDW: 17 % — ABNORMAL HIGH (ref 11.5–15.5)
WBC: 8.1 10*3/uL (ref 4.0–10.5)
nRBC: 0 % (ref 0.0–0.2)

## 2021-08-31 LAB — VITAMIN B12: Vitamin B-12: 1591 pg/mL — ABNORMAL HIGH (ref 180–914)

## 2021-08-31 LAB — FOLATE: Folate: 50.1 ng/mL (ref >5.9)

## 2021-08-31 MED ORDER — LANSOPRAZOLE 30 MG PO CPDR
60.0000 mg | DELAYED_RELEASE_CAPSULE | Freq: Every day | ORAL | Status: DC
  Administered 2021-09-01: 60 mg via ORAL
  Filled 2021-08-31: qty 2

## 2021-08-31 MED ORDER — CARVEDILOL 3.125 MG PO TABS: 3.1250 mg | ORAL_TABLET | Freq: Two times a day (BID) | ORAL | Status: DC

## 2021-08-31 MED ORDER — CARVEDILOL 3.125 MG PO TABS
3.1250 mg | ORAL_TABLET | Freq: Two times a day (BID) | ORAL | Status: DC
  Administered 2021-08-31 – 2021-09-01 (×3): 3.125 mg via ORAL
  Filled 2021-08-31 (×3): qty 1

## 2021-08-31 MED ORDER — LANSOPRAZOLE 30 MG PO CPDR: 30.0000 mg | DELAYED_RELEASE_CAPSULE | Freq: Every day | ORAL | Status: DC

## 2021-08-31 NOTE — Discharge Instructions (Addendum)
Dear Jordan Nicholson, ? ?Thank you for letting us participate in your care. You were hospitalized for lightheadedness and diagnosed with Hypertensive emergency. You were treated with an IV medication to help lower your blood pressure and then started on an oral medication to help with your blood pressure.  ? ?POST-HOSPITAL & CARE INSTRUCTIONS ?We have changed your blood thinner dose. You should take 2.5 mg twice a day INSTEAD of the 5 mg twice a day. This reduced dose is recommended based on your age and weight.  ?We have started you on a new medication called Carvedilol. This will help to keep your blood pressures under better control. You can monitor your blood pressures at home.  ?Continue to hydrate throughout the day to prevent dehydration.  ?Call to schedule follow-up with your primary care provider this week. ?Go to your follow up appointments (listed below) ? ? ?DOCTOR'S APPOINTMENT   ?No future appointments. ? Follow-up Information   ? ? Irena Reichmann, DO. Call in 1 day(s).   ?Specialty: Family Medicine ?Why: Call to schedule a follow up this week. ?Contact information: ?[address] ?[address] ?[place] [address] ?[phone] ? ? ?  ?  ? ?  ?  ? ?  ? ? ?Take care and be well! ? ?Family Medicine Teaching Service Inpatient Team ?[facility]  ?[facility]  ?[address] [place], [place] [address] ?[phone] ? ? ? ? ? ? ?Medicare Outpatient Observation Notice ?  ?Patient name:  Jordan Nicholson Patient number:  [id number]  ?                                                                                                                                                                     ?You?re a hospital outpatient receiving observation services. You are not an inpatient because: ? ?  ?hypertension ?  ?You require hospital care for evaluation and/or treatment.  It is expected you will need hospital care for less than a total of two days.  ?                                                                                                                                                                      ?  ?  Being an outpatient may affect what you pay in a hospital: ?  ?When you?re a hospital outpatient, your observation stay is covered under Medicare Part B. ?  ?For Part B services, you generally pay: ?  ?A copayment for each outpatient hospital service you get. Part B copayments may vary by type of service. ?  ?20% of the Medicare-approved amount for most doctor services, after the Part B deductible. ?  ?Observation services may affect coverage and payment of your care after you leave the hospital: ? ?  ? ?If you need skilled nursing facility (SNF) care after you leave the hospital, Medicare Part A will only cover SNF care if you?ve had a 3-day minimum, medically necessary, inpatient hospital stay for a related illness or injury. An inpatient hospital stay begins the day the hospital admits you as an inpatient based on a doctor?s order and doesn?t include the day you?re discharged. ?  ?If you have Medicaid, a Medicare Advantage plan or other health plan, Medicaid or the plan may have different rules for SNF coverage after you leave the hospital. Check with Medicaid or your plan. ?  ?NOTE: Medicare Part A generally doesn?t cover outpatient hospital services, like an observation stay. However, Part A will generally cover medically necessary inpatient services if the hospital admits you as an inpatient based on a doctor?s order. In most cases, you?ll pay a one-time deductible for all of your inpatient hospital services for the first 60 days you?re in a hospital. ?                                                                                                                                                                     ?If you have any questions about your observation services, ask the hospital staff member giving you this notice or the doctor providing your hospital care. You can also ask to speak  with someone from the hospital?s utilization or discharge planning department. ?  ?You can also call 1-800-MEDICARE [phone]).  TT[employer] users should call [phone]. ?  ?Form CMS 10611-MOON   Expiration 05/18/2021 OMB APPROVAL [id number]  ?  ?  ? ?  ? ?Your costs for medications: ? ?  ? ?Generally, prescription and over-the-counter drugs, including ?self-administered drugs,? you get in a hospital outpatient setting (like an emergency department) aren?t covered by Part B. ?Self- administered drugs? are drugs you?d normally take on your own. For safety reasons, many hospitals don?t allow you to take medications brought from home. If you have a Medicare prescription drug plan (Part D), your plan may help you pay for these drugs. You?ll likely need to pay out-of- pocket for these drugs and submit a claim to your drug plan for a refund. Contact your drug plan for  more information. ?  ?                                                                                                                                                                     ?If you?re enrolled in a Medicare Advantage plan (like an HMO or PPO) or other Medicare health plan (Part C), your costs and coverage may be different. Check with your plan to find out about coverage for outpatient observation services. ? ?  ?If you?re a Science writer through your state Medicaid program, you can?t be billed for Part A or Part B deductibles, coinsurance, and copayments. ? ?                                                                                                                                                                    ?Additional Information (Optional): ?  ?  ?  ?  ?  ?                                                                                                                                                                     ?Please sign below to show you received and understand this notice. ? ?  ? ?  Date: 08/31/21 / Time:2:04 PM ?  ?CMS does not discriminate in its programs and activities. To request this publication in alternative format, please call: 1-800-MEDICARE or email:[email or web address]. ?  ?Form CMS 10611-MOON   Expiration 05/18/2021 OMB APPROVAL [id number]  ?  ? ? ?Patient ? ? ?Add ?No image attached ?Trace ?Slow ?Corrupt ?Edit Data ?Change Template ?Print ?On  ? ?

## 2021-08-31 NOTE — Evaluation (Signed)
Occupational Therapy Evaluation ?Patient Details ?Name: Jordan Nicholson ?MRN: [id number] ?DOB: 1926/07/28 ?Today's Date: 08/31/2021 ? ? ?History of Present Illness Pt is a 88 y.o. female who presented 08/29/21 with near syncope and weakness. Pt hypertensive upon ED arrival. CT head with remote infarcts; no acute changes. PMH: TIA, CVA, Afib, GERD, LE venous insufficiency, esophageal surgery, cervical myelopathy, fibromyalgia  ? ?Clinical Impression ?  ?PTA, pt was living at ILF and performing BADLsl was receiving therapy 5 days a week for mobility and shoulder ROM (after recent shoulder injury s/p fall). Pt reporting she is NWB and PROM/AAROM for shoulder. Pt engaging in shoulder ROM exercises at EOB with pendulums and table slides. Pt would benefit from further acute OT to facilitate safe dc. Recommend dc to IFL and resume therpay for further OT to optimize safety, independence with ADLs, and return to PLOF.   ?   ? ?Recommendations for follow up therapy are one component of a multi-disciplinary discharge planning process, led by the attending physician.  Recommendations may be updated based on patient status, additional functional criteria and insurance authorization.  ? ?Follow Up Recommendations ? Other (comment) (Resume therapies at ILF)  ?  ?Assistance Recommended at Discharge PRN  ?Patient can return home with the following   ? ?  ?Functional Status Assessment ?    ?Equipment Recommendations ? None recommended by OT  ?  ?Recommendations for Other Services   ? ? ?  ?Precautions / Restrictions Precautions ?Precautions: Fall;Other (comment) ?Precaution Comments: watch BP ?Required Braces or Orthoses: Sling (for OOB) ?Restrictions ?Weight Bearing Restrictions: Yes ?Other Position/Activity Restrictions: NWB L UE per pt 2/2 recent fx; performing pendulums and P/AAROM of shoulder only with PT/OT at ILF per pt  ? ?  ? ?Mobility Bed Mobility ?Overal bed mobility: Needs Assistance ?Bed Mobility: Supine to Sit ?  ?  ?Supine to  sit: Supervision, HOB elevated ?  ?  ?General bed mobility comments: Supervision for safety. Increased time. exiting to R ?  ? ?Transfers ?Overall transfer level: Needs assistance ?Equipment used: None ?Transfers: Sit to/from Stand ?Sit to Stand: Supervision ?  ?  ?  ?  ?  ?General transfer comment: Supervision for safety ?  ? ?  ?Balance Overall balance assessment: Needs assistance ?Sitting-balance support: No upper extremity supported, Feet supported ?Sitting balance-Leahy Scale: Good ?  ?  ?Standing balance support: Single extremity supported, During functional activity ?Standing balance-Leahy Scale: Fair ?Standing balance comment: Able to maintain static standing ?  ?  ?  ?  ?  ?  ?  ?  ?  ?  ?  ?   ? ?ADL either performed or assessed with clinical judgement  ? ?ADL Overall ADL's : Needs assistance/impaired ?Eating/Feeding: Set up ?  ?Grooming: Set up;Sitting ?  ?Upper Body Bathing: Minimal assistance;Sitting ?  ?Lower Body Bathing: Min guard;Sit to/from stand ?  ?Upper Body Dressing : Minimal assistance;Sitting ?  ?Lower Body Dressing: Min guard;Sit to/from stand ?  ?Toilet Transfer: Supervision/safety;Ambulation (simualted to reclienr) ?  ?  ?  ?  ?  ?Functional mobility during ADLs: Supervision/safety;Cane ?General ADL Comments: Pt demonstrating near baseline function. reviewed shoulder protocal and exercises  ? ? ? ?Vision   ?   ?   ?Perception   ?  ?Praxis   ?  ? ?Pertinent Vitals/Pain Pain Assessment ?Pain Assessment: Faces ?Faces Pain Scale: Hurts little more ?Pain Location: headache ?Pain Descriptors / Indicators: Headache ?Pain Intervention(s): Monitored during session, Limited activity within patient's tolerance, Repositioned  ? ? ? ?  Hand Dominance   ?  ?Extremity/Trunk Assessment Upper Extremity Assessment ?Upper Extremity Assessment: LUE deficits/detail ?LUE Deficits / Details: prior fall with shoulder injury/fx. Othro protocal for PROM/AAROM only with pendulums and forward table slides. ?LUE  Coordination: decreased gross motor ?  ?  ?  ?Cervical / Trunk Assessment ?Cervical / Trunk Assessment: Kyphotic ?  ?Communication Communication ?Communication: No difficulties ?  ?Cognition Arousal/Alertness: Awake/alert ?Behavior During Therapy: [facility] for tasks assessed/performed ?Overall Cognitive Status: Within Functional Limits for tasks assessed ?  ?  ?  ?  ?  ?  ?  ?  ?  ?  ?  ?  ?  ?  ?  ?  ?  ?  ?  ?General Comments  BP 160/56 ? ?  ?Exercises Exercises: Other exercises ?Other Exercises ?Other Exercises: Seated penedulums; 20x ?Other Exercises: table slides forward flexion; x20 ?Other Exercises: elbow ORM; 20x AAROM ?  ?Shoulder Instructions    ? ? ?Home Living Family/patient expects to be discharged to:: Assisted living [facility]) ?  ?  ?  ?  ?  ?  ?  ?  ?  ?  ?  ?  ?  ?  ?Home Equipment: Gilmer Mor - single point;Cane - quad;Grab bars - tub/shower;Grab bars - toilet;Shower seat - built in ?  ?Additional Comments: Elevator access 1-level apartment, walk-in shower, standard toilet height ?  ? ?  ?Prior Functioning/Environment Prior Level of Function : Independent/Modified Independent ?  ?  ?  ?  ?  ?  ?Mobility Comments: Going to PT/OT 5x/week for gait training and for L shoulder fx. x2 recent falls. Normally uses her SPC. ?ADLs Comments: Going to PT/OT 5x/week for gait training and for L shoulder fx. Has difficulty managing tight pants at times. "I can't do buttons". Has a car but has not droven in a while. Capable of being mod I for all ADLs, tends to eat dinner in dining room. ?  ? ?  ?  ?OT Problem List: Decreased strength;Decreased range of motion;Impaired balance (sitting and/or standing);Decreased activity tolerance ?  ?   ?OT Treatment/Interventions: Self-care/ADL training;Therapeutic exercise;Energy conservation;DME and/or AE instruction  ?  ?OT Goals(Current goals can be found in the care plan section) Acute Rehab OT Goals ?Patient Stated Goal: Go home ?OT Goal Formulation: With patient/family ?Time For  Goal Achievement: 09/14/21 ?Potential to Achieve Goals: Good  ?OT Frequency: Min 2X/week ?  ? ?Co-evaluation   ?  ?  ?  ?  ? ?  ?AM-PAC OT "6 Clicks" Daily Activity     ?Outcome Measure Help from another person eating meals?: None ?Help from another person taking care of personal grooming?: A Little ?Help from another person toileting, which includes using toliet, bedpan, or urinal?: A Little ?Help from another person bathing (including washing, rinsing, drying)?: A Little ?Help from another person to put on and taking off regular upper body clothing?: A Little ?Help from another person to put on and taking off regular lower body clothing?: A Little ?6 Click Score: 19 ?  ?End of Session Nurse Communication: Mobility status ? ?Activity Tolerance: Patient tolerated treatment well ?Patient left: in chair;with call bell/phone within reach;with family/visitor present ? ?OT Visit Diagnosis: Unsteadiness on feet (R26.81);Other abnormalities of gait and mobility (R26.89);Muscle weakness (generalized) (M62.81)  ?              ?Time: 7588-3254 ?OT Time Calculation (min): 21 min ?Charges:  OT General Charges ?$OT Visit: 1 Visit ?OT Evaluation ?$OT Eval Low Complexity:  1 Low ? ?Mackey Varricchio MSOT, OTR/L ?Acute Rehab ?Pager: [phone] ?Office: [phone] ? ?Britanni Yarde M Keala Drum ?08/31/2021, 5:02 PM ?

## 2021-08-31 NOTE — Care Management Obs Status (Signed)
MEDICARE OBSERVATION STATUS NOTIFICATION ? ? ?Patient Details  ?Name: Jordan Nicholson ?MRN: [id number] ?Date of Birth: 1926-11-06 ? ? ?Medicare Observation Status Notification Given:  Yes ?Given over phone due to remote, permission to sign given, copy to DC instructions for print out ? ? ?Verdell Carmine, RN ?08/31/2021, 2:07 PM ?

## 2021-08-31 NOTE — Progress Notes (Signed)
Called on-call cardiologist (Dr. Harl Bowie) to discuss patient. Per the ER notation, cardiology had been consulted but there had not been any follow-up noted yet. Dr. Harl Bowie said that she was not on their list, but that she would now be seen today. Appreciate the assistance from cardiology. ? ? ?Jordan Poucher, DO  ?

## 2021-08-31 NOTE — Progress Notes (Signed)
Family Medicine Teaching Service ?Daily Progress Note ?Intern Pager: [phone] ? ?Patient name: Jordan Nicholson Medical record number: [id number] ?Date of birth: [birth year] Age: 88 y.o. Gender: female ? ?Primary Care Provider: Janie Morning, DO ?Consultants: Cardiology ?Code Status: Full ? ?Pt Overview and Major Events to Date:  ?4/14 - Admitted with nitro drip on  ? ?Assessment and Plan: ?Jordan Nicholson is a 88 y.o. female presenting with near syncope and weakness. PMH is significant for TIA, stroke, Afib, GERD, LE venous insufficieny, esophageal surgery. ? ?Pre-syncope  Palpitations ?Echo 4/14 with normal EF and G1DD. AM EKG: Regular rate, there are some possible P waves present, appears to be a supraventricular bigeminy (also noted in EKGs on 4/14 and 3/18). TSH wnl.  Unclear etiology at this time, EKG findings appear to have been present since at least March. ?- Cardiology consulted, awaiting recommendations ?- Weaning off nitroglycerin drip ?- Consider transitioning to oral medication, possibly carvedilol ?- Continuous cardiac monitoring ?- Orthostatics pending ? ?Nonoliguric AKI ?Cr increased overnight from 0.88>1.23. Possibly related to the hypertensive emergency or pre-renal etiology.  Denying urinary symptoms. ?- Trend with BMP, if worsening or not improving, consider renal US and nephrology ?- Encourage p.o. intake, will trend and consider IV if not adequate intake ?- Obtain UA ? ?Hypertensive emergency, improving ?BP overnight in the 140s/60-80s on nitroglycerin drip. With history of afib, may consider starting carvedilol . ?- Cardiology consulted on admission ?- Transition off nitro drip  ?- Consider carvedilol ? ?Chronic Afib  ?Supraventricular bigeminy ?Rate controlled since admission.  Repeat EKG with periods of sinus rhythm with supraventricular bigeminy.  EKG findings noted on prior EKGs in March 2023 as well. ?- Continuous cardiac telemetry ?- Eliquis 5mg  BID ? ?GERD ?- Home Dexilant ?- Pepcid 20mg   daily ? ?Weakness ?- PT/OT eval ? ?FEN/GI: Dexilant, pepcid, heart healthy ?PPx: on Eliquis ?Dispo:Pending PT recommendations  tomorrow. Barriers include continued cardiac evaluation, weaning nitro drip.  ? ?Subjective:  ?Patient reports that she overall is feeling okay.  She does have a little bit of chest pain that is chronic for her and she relates to her esophageal pain.  She does have some minor stomach pain, which she notes is more like her reflux and she has taken her medications this morning as well as a Gas-X. ? ?Objective: ?Temp:  [97.7 ?F (36.5 ?C)-98.5 ?F (36.9 ?C)] 98.2 ?F (36.8 ?C) (04/15 0800) ?Pulse Rate:  [71-95] 81 (04/15 0800) ?Resp:  [14-27] 21 (04/15 0800) ?BP: (134-190)/(50-93) 134/65 (04/15 0800) ?SpO2:  [94 %-99 %] 94 % (04/15 0800) ?Physical Exam: ?General: NAD, elderly female, well-appearing, sitting up in bed ?Cardiovascular: Irregular rate, on cardiac telemetry, blowing systolic murmur ?Respiratory: Breathing comfortably on room air, clear to auscultation bilaterally ?Abdomen: Mild discomfort with palpation, nondistended ?Extremities: Moves all extremities equally and appropriately, bilateral chronic venous stasis changes, tender to palpation when assessing for edema ? ?Laboratory: ?Recent Labs  ?Lab 08/29/21 ?1721 08/31/21 ?0051  ?WBC 8.5 8.1  ?HGB 11.7* 10.5*  ?HCT 32.2* 28.8*  ?PLT 256 231  ? ?Recent Labs  ?Lab 08/29/21 ?1721 08/31/21 ?0051  ?NA 139 141  ?K 4.0 4.7  ?CL 105 110  ?CO2 24 24  ?BUN 22 27*  ?CREATININE 0.88 1.23*  ?CALCIUM 9.7 8.7*  ?PROT 7.0  --   ?BILITOT 0.5  --   ?ALKPHOS 79  --   ?ALT 12  --   ?AST 21  --   ?GLUCOSE 89 136*  ? ? ? ?Imaging/Diagnostic Tests: ?ECHOCARDIOGRAM COMPLETE ? ?  Result Date: 08/30/2021 ?   ECHOCARDIOGRAM REPORT   Patient Name:   Jordan Nicholson Date of Exam: 08/30/2021 Medical Rec #:  [id number]  Height:       61.5 in Accession #:    [id number] Weight:       101.0 lb Date of Birth:  Oct 27, 1926  BSA:          1.421 m? Patient Age:    24 years   BP:            148/69 mmHg Patient Gender: F          HR:           77 bpm. Exam Location:  Inpatient Procedure: 2D Echo Indications:    Syncope  History:        Patient has no prior history of Echocardiogram examinations.  Sonographer:    Jefferey Pica Referring Phys: [id number] [place]  1. Left ventricular ejection fraction, by estimation, is 55 to 60%. The left ventricle has normal function. The left ventricle has no regional wall motion abnormalities. There is mild left ventricular hypertrophy. Left ventricular diastolic parameters are consistent with Grade I diastolic dysfunction (impaired relaxation).  2. Right ventricular systolic function is normal. The right ventricular size is normal.  3. The mitral valve is grossly normal. Trivial mitral valve regurgitation. Moderate mitral annular calcification.  4. The aortic valve is tricuspid. There is moderate calcification of the aortic valve. There is moderate thickening of the aortic valve. Aortic valve regurgitation is mild. Mild aortic valve stenosis. FINDINGS  Left Ventricle: Left ventricular ejection fraction, by estimation, is 55 to 60%. The left ventricle has normal function. The left ventricle has no regional wall motion abnormalities. The left ventricular internal cavity size was normal in size. There is  mild left ventricular hypertrophy. Left ventricular diastolic parameters are consistent with Grade I diastolic dysfunction (impaired relaxation). Right Ventricle: The right ventricular size is normal. Right vetricular wall thickness was not well visualized. Right ventricular systolic function is normal. Left Atrium: Left atrial size was normal in size. Right Atrium: Right atrial size was normal in size. Pericardium: There is no evidence of pericardial effusion. Mitral Valve: The mitral valve is grossly normal. Moderate mitral annular calcification. Trivial mitral valve regurgitation. Tricuspid Valve: The tricuspid valve is normal in structure.  Tricuspid valve regurgitation is not demonstrated. Aortic Valve: The aortic valve is tricuspid. There is moderate calcification of the aortic valve. There is moderate thickening of the aortic valve. Aortic valve regurgitation is mild. Aortic regurgitation PHT measures 680 msec. Mild aortic stenosis is present. Aortic valve mean gradient measures 12.7 mmHg. Aortic valve peak gradient measures 25.3 mmHg. Aortic valve area, by VTI measures 1.42 cm?. Pulmonic Valve: The pulmonic valve was normal in structure. Pulmonic valve regurgitation is not visualized. Aorta: The aortic root and ascending aorta are structurally normal, with no evidence of dilitation. IAS/Shunts: The interatrial septum was not well visualized.  LEFT VENTRICLE PLAX 2D LVIDd:         3.70 cm   Diastology LVIDs:         2.60 cm   LV e' lateral:   9.41 cm/s LV PW:         1.10 cm   LV E/e' lateral: 10.6 LV IVS:        1.10 cm LVOT diam:     2.00 cm LV SV:         75 LV SV Index:   53 LVOT Area:  3.14 cm?  RIGHT VENTRICLE         IVC TAPSE (M-mode): 1.9 cm  IVC diam: 1.40 cm LEFT ATRIUM             Index        RIGHT ATRIUM           Index LA diam:        3.10 cm 2.18 cm/m?   RA Area:     12.40 cm? LA Vol (A2C):   32.0 ml 22.52 ml/m?  RA Volume:   28.40 ml  19.98 ml/m? LA Vol (A4C):   58.3 ml 41.02 ml/m? LA Biplane Vol: 45.7 ml 32.16 ml/m?  AORTIC VALVE                     PULMONIC VALVE AV Area (Vmax):    1.34 cm?      PV Vmax:       0.70 m/s AV Area (Vmean):   1.19 cm?      PV Peak grad:  2.0 mmHg AV Area (VTI):     1.42 cm? AV Vmax:           251.67 cm/s AV Vmean:          162.000 cm/s AV VTI:            0.526 m AV Peak Grad:      25.3 mmHg AV Mean Grad:      12.7 mmHg LVOT Vmax:         107.00 cm/s LVOT Vmean:        61.500 cm/s LVOT VTI:          0.238 m LVOT/AV VTI ratio: 0.45 AI PHT:            680 msec  AORTA Ao Asc diam: 3.60 cm MITRAL VALVE MV Area (PHT): 2.59 cm?     SHUNTS MV Decel Time: 293 msec     Systemic VTI:  0.24 m MV E velocity:  100.00 cm/s  Systemic Diam: 2.00 cm MV A velocity: 141.00 cm/s MV E/A ratio:  0.71 Mertie Moores MD Electronically signed by Mertie Moores MD Signature Date/Time: 08/30/2021/3:28:20 PM    Final     ? ?Placido Hangartner, DO

## 2021-08-31 NOTE — Plan of Care (Signed)

## 2021-08-31 NOTE — Consult Note (Addendum)
?Cardiology Consultation:  ? ?Patient ID: Jordan Nicholson ?MRN: [id number]; DOB: 07/03/1926 ? ?Admit date: 08/29/2021 ?Date of Consult: 08/31/2021 ? ?PCP:  Irena Reichmann, DO ?  ?[facility] Providers ?Cardiologist:  None   { ? ?Patient Profile:  ? ?Jordan Nicholson is a 88 y.o. female with a history of paroxysmal atrial fibrillation on Eliquis, chronic lower extremity edema, prior DVT, stroke, GERD, fibromyalgia, cervical myelopathy, and migraines who is being seen 08/31/2021 for the evaluation of atrial fibrillation and hypertensive emergency at the request of Dr. Miquel Dunn. ? ?History of Present Illness:  ? ?Jordan Nicholson is a 88 year old with the above history.  She does have a history of paroxysmal atrial fibrillation and is on Eliquis but does not looks like she follows with a Development worker, international aid.  ? ?She has a history of recurrent falls; yesterday while at the [employer] store she began to feel not right.  There was some headache.  Most notably, it was a strength of falling that prompted her to need to sit down.  Moreover, as her daughter held her in position, there would be 1-3-minute waves of a sensation of falling even though she was not moving.  There was no sensation of rotation with this.  ? ?She has had other episodes where she has fallen.  This sensation is of acute weakness where her knees collapse.  She has not been noted with these to have any significant change in color.  She is able to stand with assistance.   ? ?She also has a history of orthostatic lightheadedness relieved by sitting. ? ?Does not have a history of hypertension but over the last month or so, with going to the wound care clinic, blood pressures have been repeatedly over 180 or so.  Blood pressures at the outpatient setting 1/23 112/70 ?  ?Her diagnosis of atrial fibrillation dates back to when she had a stroke 9 years ago.  She does not recall where this occurred has been treated with Eliquis.  2.5 twice daily. ? ? ?Presented to the [facility] ED on 08/29/2021 for  dizziness and near syncope as well as chest pain.  Upon arrival, she was found to be markedly hypertensive with BP of 207/85. EKG showed normal sinus rhythm, with PACs but no acute ST/T changes.  Sensitivity troponin was minimally elevated and flat at 31 >> 28. WBC 8.5, Hgb 11.7, Plts 256. Na 139, K 4.0, Glucose 89, BUN 22, Cr 0.88. LFTs normal. Head CT showed remote infarcts but no acute findings. She was started on IV Nitro drip and Coreg 3.125mg  twice daily for her BP. Echo showed LVEF of 5-60% with normal wall motion, mild LVH, and grade 1 diastolic dysfunction as well as mild AS/AI.  Allergy was consulted for further evaluation. ? ? ? ? ?Past Medical History:  ?Diagnosis Date  ? Anxiety   ? Atrial fibrillation (HCC)   ? Cervical myelopathy (HCC)   ? Chronic renal insufficiency   ? Depression   ? DVT (deep venous thrombosis) (HCC)   ? Left lower extremity  ? Fibromyalgia   ? Migraine   ? Myelopathy (HCC)   ? ? ?Past Surgical History:  ?Procedure Laterality Date  ? ESOPHAGUS SURGERY    ?  ? ?Home Medications:  ?Prior to Admission medications   ?Medication Sig Start Date End Date Taking? Authorizing Provider  ?apixaban (ELIQUIS) 5 MG TABS tablet Take 5 mg by mouth 2 (two) times daily. 11/27/14  Yes [provider]  ?Biotin 18841 MCG TABS Take 10,000  mcg by mouth daily.   Yes [provider]  ?Coenzyme Q10 (COQ10) 100 MG CAPS Take 100 mg by mouth daily.   Yes [provider]  ?DEXILANT 60 MG capsule Take 1 capsule by mouth 2 (two) times daily. 03/08/18  Yes [provider]  ?dicyclomine (BENTYL) 20 MG tablet Take 20 mg by mouth daily as needed (IBS).   Yes [provider]  ?estrogens, conjugated, (PREMARIN) 0.625 MG tablet Take 0.625 mg by mouth every other day.   Yes [provider]  ?famotidine (PEPCID) 20 MG tablet Take 20 mg by mouth daily. 03/24/18  Yes [provider]  ?Isometheptene-Dichloral-APAP (MIDRIN PO) Take 2 capsules by mouth daily as  needed (migraine).   Yes [provider]  ?sucralfate (CARAFATE) 1 GM/10ML suspension Take 1 g by mouth daily as needed (IBS). 10/30/14  Yes [provider]  ?memantine (NAMENDA) 5 MG tablet Take 1 tablet daily for one week, then take 1 tablet twice daily for one week, then take 1 tablet in the morning and 2 in the evening for one week, then take 2 tablets twice daily ?Patient not taking: Reported on 08/30/2021 10/18/18   York Spaniel, MD  ? ? ?Inpatient Medications: ?Scheduled Meds: ? apixaban  2.5 mg Oral BID  ? carvedilol  3.125 mg Oral BID WC  ? famotidine  20 mg Oral Daily  ? [START ON 09/01/2021] lansoprazole  60 mg Oral Q1200  ? sodium chloride flush  3 mL Intravenous Once  ? ?Continuous Infusions: ? nitroGLYCERIN 10 mcg/min (08/30/21 1043)  ? ?PRN Meds: ?acetaminophen ? ?Allergies:    ?Allergies  ?Allergen Reactions  ? Codeine Nausea Only  ? Citrus Other (See Comments)  ?  Acid reflux  ? Coffee Bean Extract [Coffea Arabica] Other (See Comments)  ?  Esophageal spasm  ? Cymbalta [Duloxetine Hcl] Other (See Comments)  ?  Esophagitis  ? Elavil [Amitriptyline] Other (See Comments)  ?  Pt does not remember reaction  ? Kenalog [Triamcinolone] Hives  ? Lactose Diarrhea  ?  Lactose intolerant  ? Lipitor [Atorvastatin] Other (See Comments)  ?  Myalgia  ? Lyrica [Pregabalin] Other (See Comments)  ?  Esophagitis  ? Neurontin [Gabapentin] Other (See Comments)  ?  GI burning, acid reflux, diarrhea  ? Nexium [Esomeprazole] Other (See Comments)  ?  Pt does not remember reaction  ? Other Other (See Comments)  ?  Chili Peppers - causes gastritis ?Cucumber - causes hives  ? Pravachol [Pravastatin] Other (See Comments)  ?  myalgia  ? Prevacid [Lansoprazole] Other (See Comments)  ?  Pt does not remember reaction  ? Prilosec [Omeprazole] Other (See Comments)  ?  Pt does not remember reaction  ? Prolia [Denosumab] Other (See Comments)  ?  Jaw pain  ? Synthroid [Levothyroxine] Other (See Comments)  ?  Pt does not  remember reaction  ? Zocor [Simvastatin] Other (See Comments)  ?  Myalgia  ? ? ?Social History:   ?Social History  ? ?Socioeconomic History  ? Marital status: Widowed  ?  Spouse name: Not on file  ? Number of children: Not on file  ? Years of education: Not on file  ? Highest education level: Not on file  ?Occupational History  ? Not on file  ?Tobacco Use  ? Smoking status: Never  ? Smokeless tobacco: Never  ?Vaping Use  ? Vaping Use: Never used  ?Substance and Sexual Activity  ? Alcohol use: Never  ? Drug use: Never  ?  Sexual activity: Not on file  ?Other Topics Concern  ? Not on file  ?Social History Narrative  ? Right handed   ? Caffeine 2 cups of tea daily  ? Lives with daughter   ? ?Social Determinants of Health  ? ?Financial Resource Strain: Not on file  ?Food Insecurity: Not on file  ?Transportation Needs: Not on file  ?Physical Activity: Not on file  ?Stress: Not on file  ?Social Connections: Not on file  ?Intimate Partner Violence: Not on file  ?  ?Family History:   ?Family History  ?Problem Relation Age of Onset  ? Non-Hodgkin's lymphoma Mother   ? Dementia Father   ? Cancer - Lung Sister   ? Heart disease Sister   ?  ? ?ROS:  ?Please see the history of present illness.  ?  ?All other ROS reviewed and negative.    ? ?Physical Exam/Data:  ? ?Vitals:  ? 08/30/21 2318 08/31/21 0311 08/31/21 0800 08/31/21 1149  ?BP: (!) 148/87 (!) 148/63 134/65 (!) 158/76  ?Pulse: 93 71 81 75  ?Resp: 15 16 (!) 21 (!) 24  ?Temp: 98.4 ?F (36.9 ?C) 98.5 ?F (36.9 ?C) 98.2 ?F (36.8 ?C) 97.9 ?F (36.6 ?C)  ?TempSrc: Oral Oral Oral Oral  ?SpO2: 98% 98% 94% 93%  ?Weight:      ?Height:      ? ? ?Intake/Output Summary (Last 24 hours) at 08/31/2021 1510 ?Last data filed at 08/31/2021 0900 ?Gross per 24 hour  ?Intake 294.79 ml  ?Output --  ?Net 294.79 ml  ? ? ?  08/29/2021  ?  5:00 PM 08/03/2021  ?  1:59 AM 11/08/2018  ?  1:38 PM  ?Last 3 Weights  ?Weight (lbs) 100 lb 15.5 oz 101 lb 102 lb  ?Weight (kg) 45.8 kg 45.813 kg 46.267 kg  ?   ?Body  mass index is 18.77 kg/m?.  ?General:  Well nourished, well developed, in no acute distress  ?HEENT: normal ?Neck: no JVD ?Vascular: No carotid bruits; Distal pulses 2+ bilaterally ?Cardiac:  normal S1, S2;

## 2021-08-31 NOTE — Evaluation (Addendum)
Physical Therapy Evaluation & Discharge ?Patient Details ?Name: Jordan Nicholson ?MRN: [id number] ?DOB: [birth year] ?Today's Date: 08/31/2021 ? ?History of Present Illness ? Pt is a 88 y.o. female who presented 08/29/21 with near syncope and weakness. Pt hypertensive upon ED arrival. CT head with remote infarcts; no acute changes. PMH: TIA, CVA, Afib, GERD, LE venous insufficiency, esophageal surgery, cervical myelopathy, fibromyalgia ?  ?Clinical Impression ? Pt presents with condition above and deficits mentioned below, see PT Problem List. PTA, she was living alone in an apartment at an ILF, functioning mod I with a SPC. She reports x2 recent falls, 1x resulting in her L upper arm fx and 1x with this near syncope episode. Pt reports she is NWB and allowed to only do pendulums and P/AAROM with her L shoulder currently. Pt is appearing to and reports to be functioning at her baseline, not displaying LOB or need for physical assistance with mobility. She does benefit from 1 UE support for improved stability though as she does display deficits in balance. All education completed and questions answered. PT will sign off. ? ?BP  ?169/76 supine ?173/74 sitting ?188/73 standing ?199/105 standing ~3 min ?   ? ?Recommendations for follow up therapy are one component of a multi-disciplinary discharge planning process, led by the attending physician.  Recommendations may be updated based on patient status, additional functional criteria and insurance authorization. ? ?Follow Up Recommendations Home health PT (at her ILF) ? ?  ?Assistance Recommended at Discharge PRN  ?Patient can return home with the following ? A little help with bathing/dressing/bathroom;Assistance with cooking/housework;Assist for transportation ? ?  ?Equipment Recommendations None recommended by PT  ?Recommendations for Other Services ?    ?  ?Functional Status Assessment Patient has had a recent decline in their functional status and demonstrates the ability to  make significant improvements in function in a reasonable and predictable amount of time.  ? ?  ?Precautions / Restrictions Precautions ?Precautions: Fall;Other (comment) ?Precaution Comments: watch BP ?Required Braces or Orthoses: Sling (not currently in room, at home) ?Restrictions ?Weight Bearing Restrictions: Yes ?Other Position/Activity Restrictions: NWB L UE per pt 2/2 recent fx; performing pendulums and P/AAROM of shoulder only with PT/OT at ILF per pt  ? ?  ? ?Mobility ? Bed Mobility ?Overal bed mobility: Needs Assistance ?Bed Mobility: Supine to Sit ?  ?  ?Supine to sit: Supervision, HOB elevated ?  ?  ?General bed mobility comments: Extra time, but able to come to sit R EOB with HOB elevated without assistance. ?  ? ?Transfers ?Overall transfer level: Needs assistance ?Equipment used: None ?Transfers: Sit to/from Stand ?Sit to Stand: Min guard ?  ?  ?  ?  ?  ?General transfer comment: Min guard for safety, mild instability but no LOB ?  ? ?Ambulation/Gait ?Ambulation/Gait assistance: Min guard ?Gait Distance (Feet): 200 Feet (x2 bouts of ~20 ft > ~200 ft) ?Assistive device: IV Pole ?Gait Pattern/deviations: Step-through pattern, Decreased stride length ?Gait velocity: reduced ?Gait velocity interpretation: 1.31 - 2.62 ft/sec, indicative of limited community ambulator ?  ?General Gait Details: Pt with slow, mostly steady gait when provided 1 UE support on PT or IV pole, no LOB. ? ?Stairs ?  ?  ?  ?  ?  ? ?Wheelchair Mobility ?  ? ?Modified Rankin (Stroke Patients Only) ?  ? ?  ? ?Balance Overall balance assessment: Needs assistance ?Sitting-balance support: No upper extremity supported, Feet supported ?Sitting balance-Leahy Scale: Good ?  ?  ?Standing balance support: Single  extremity supported, During functional activity ?Standing balance-Leahy Scale: Poor ?Standing balance comment: Reliant on 1 UE support ?  ?  ?  ?  ?  ?  ?  ?  ?  ?  ?  ?   ? ? ? ?Pertinent Vitals/Pain Pain Assessment ?Pain Assessment:  Faces ?Faces Pain Scale: Hurts little more ?Pain Location: headache ?Pain Descriptors / Indicators: Headache ?Pain Intervention(s): Limited activity within patient's tolerance, Monitored during session, Repositioned  ? ? ?Home Living Family/patient expects to be discharged to:: Assisted living [facility]) ?  ?  ?  ?  ?  ?  ?  ?  ?Home Equipment: Kasandra Knudsen - single point;Cane - quad;Grab bars - tub/shower;Grab bars - toilet;Shower seat - built in ?Additional Comments: Elevator access 1-level apartment, walk-in shower, standard toilet height  ?  ?Prior Function Prior Level of Function : Independent/Modified Independent ?  ?  ?  ?  ?  ?  ?Mobility Comments: Going to PT/OT 5x/week for gait training and for L shoulder fx. x2 recent falls. Normally uses her SPC. ?ADLs Comments: Going to PT/OT 5x/week for gait training and for L shoulder fx. Has difficulty managing tight pants at times. "I can't do buttons". Has a car but has not droven in a while. Capable of being mod I for all ADLs, tends to eat dinner in dining room. ?  ? ? ?Hand Dominance  ?   ? ?  ?Extremity/Trunk Assessment  ? Upper Extremity Assessment ?Upper Extremity Assessment: Defer to OT evaluation (recent L upper arm fx with pt reporting restrictions consisting of being NWB and only performing pendulums and P/AAROM at this time) ?  ? ?Lower Extremity Assessment ?Lower Extremity Assessment: Overall WFL for tasks assessed (MMT scores of 4+ to 5 grossly; denies numbness/tingling) ?  ? ?Cervical / Trunk Assessment ?Cervical / Trunk Assessment: Kyphotic  ?Communication  ? Communication: No difficulties  ?Cognition Arousal/Alertness: Awake/alert ?Behavior During Therapy: [facility] for tasks assessed/performed ?Overall Cognitive Status: Within Functional Limits for tasks assessed ?  ?  ?  ?  ?  ?  ?  ?  ?  ?  ?  ?  ?  ?  ?  ?  ?  ?  ?  ? ?  ?General Comments General comments (skin integrity, edema, etc.): BP 169/76 supine, 173/74 sitting, 188/73 standing, 199/105 standing ~3  min; HR 70s-100s ? ?  ?Exercises    ? ?Assessment/Plan  ?  ?PT Assessment All further PT needs can be met in the next venue of care  ?PT Problem List Decreased range of motion;Decreased balance;Decreased mobility;Cardiopulmonary status limiting activity ? ?   ?  ?PT Treatment Interventions DME instruction;Gait training;Stair training;Functional mobility training;Therapeutic exercise;Balance training;Therapeutic activities;Neuromuscular re-education;Patient/family education   ? ?PT Goals (Current goals can be found in the Care Plan section)  ?Acute Rehab PT Goals ?Patient Stated Goal: to get better ?PT Goal Formulation: All assessment and education complete, DC therapy ?Time For Goal Achievement: 09/01/21 ?Potential to Achieve Goals: Good ? ?  ?Frequency Min 2X/week ?  ? ? ?Co-evaluation   ?  ?  ?  ?  ? ? ?  ?AM-PAC PT "6 Clicks" Mobility  ?Outcome Measure Help needed turning from your back to your side while in a flat bed without using bedrails?: A Little ?Help needed moving from lying on your back to sitting on the side of a flat bed without using bedrails?: A Little ?Help needed moving to and from a bed to a chair (including a  wheelchair)?: A Little ?Help needed standing up from a chair using your arms (e.g., wheelchair or bedside chair)?: A Little ?Help needed to walk in hospital room?: A Little ?Help needed climbing 3-5 steps with a railing? : A Little ?6 Click Score: 18 ? ?  ?End of Session   ?Activity Tolerance: Patient tolerated treatment well ?Patient left: in chair;with call bell/phone within reach ?Nurse Communication: Mobility status;Other (comment) (BP; chair alarm pad in place but no alarm box in room and could not find an empty room to borrow one or any extras in storage units on unit) ?PT Visit Diagnosis: Unsteadiness on feet (R26.81);Other abnormalities of gait and mobility (R26.89);History of falling (Z91.81) ?  ? ?Time: 4944-9675 ?PT Time Calculation (min) (ACUTE ONLY): 36 min ? ? ?Charges:   PT  Evaluation ?$PT Eval Moderate Complexity: 1 Mod ?PT Treatments ?$Gait Training: 8-22 mins ?  ?   ? ? ?Moishe Spice, PT, DPT ?Acute Rehabilitation Services  ?Pager: [phone] ?Office: [phone] ? ? ?

## 2021-09-01 ENCOUNTER — Inpatient Hospital Stay (HOSPITAL_COMMUNITY): Payer: Medicare HMO

## 2021-09-01 LAB — BASIC METABOLIC PANEL
Anion gap: 5 (ref 5–15)
BUN: 19 mg/dL (ref 8–23)
CO2: 27 mmol/L (ref 22–32)
Calcium: 8.8 mg/dL — ABNORMAL LOW (ref 8.9–10.3)
Chloride: 107 mmol/L (ref 98–111)
Creatinine, Ser: 1.02 mg/dL — ABNORMAL HIGH (ref 0.44–1.00)
GFR, Estimated: 52 mL/min — ABNORMAL LOW (ref >60)
Glucose, Bld: 81 mg/dL (ref 70–99)
Potassium: 4.2 mmol/L (ref 3.5–5.1)
Sodium: 139 mmol/L (ref 135–145)

## 2021-09-01 IMAGING — MR MR CERVICAL SPINE W/O CM
4 of 6 series · 17 of 48 positions shown · non-contrast
Comparison: CT from [DATE].

CLINICAL DATA: Initial evaluation for cervical radiculopathy.

EXAM:
MRI CERVICAL SPINE WITHOUT CONTRAST
TECHNIQUE: Multiplanar, multisequence MR imaging of the cervical spine was
performed. No intravenous contrast was administered.

[Series 3: T2 · sagittal · 3.0mm · 0.39mm/px · 4 of 19 slices shown (1 of 2)]
[im 1/19]
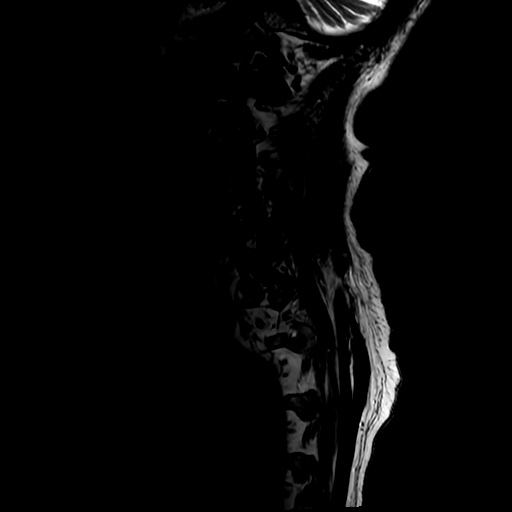
[im 7/19]
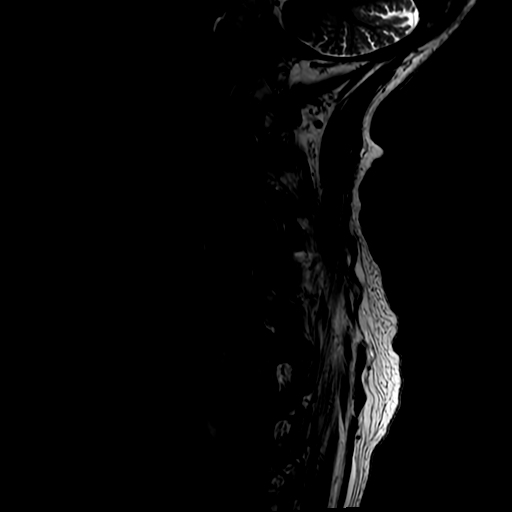
[im 13/19]
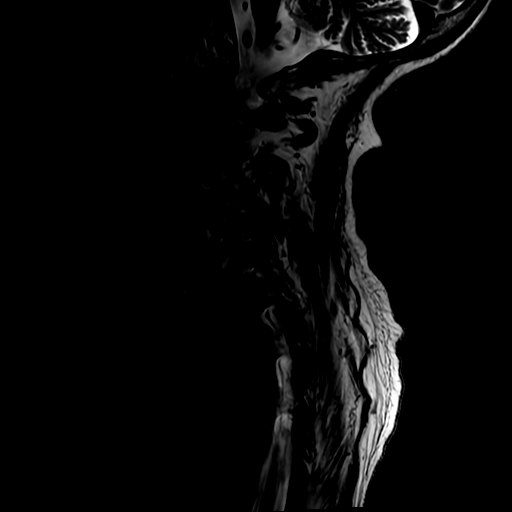
[im 19/19]
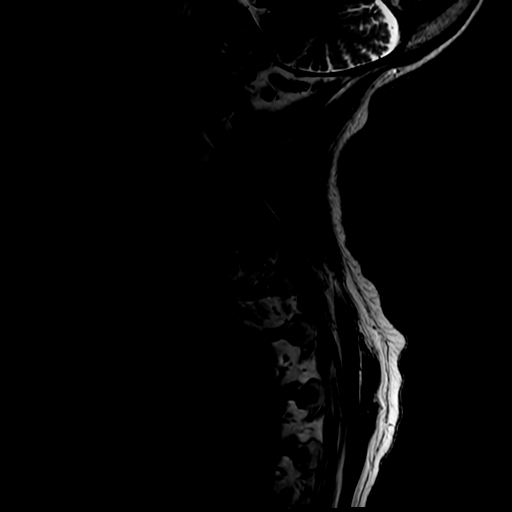

[Series 4: FLAIR · sagittal · 3.0mm · 0.39mm/px · 3 of 19 slices shown]
[im 1/19]
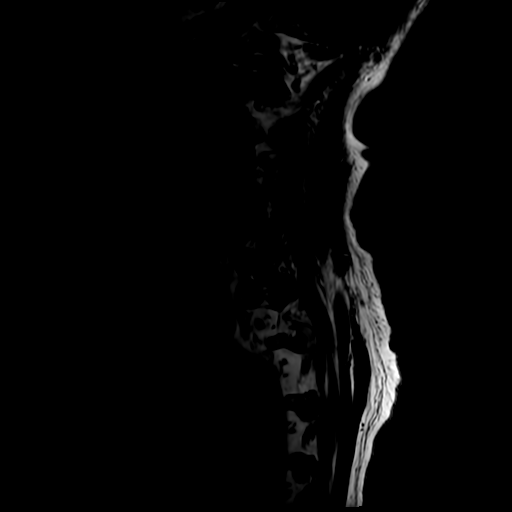
[im 13/19]
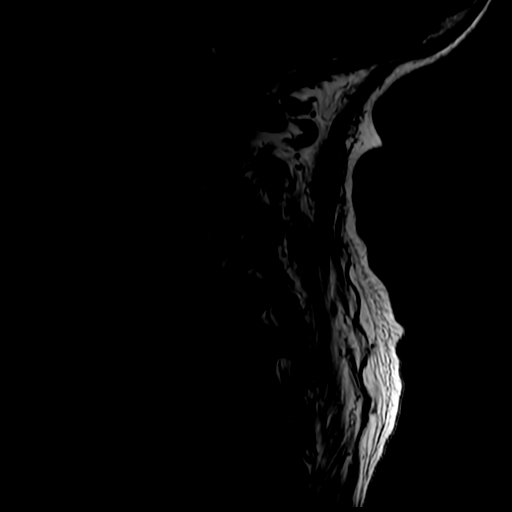
[im 19/19]
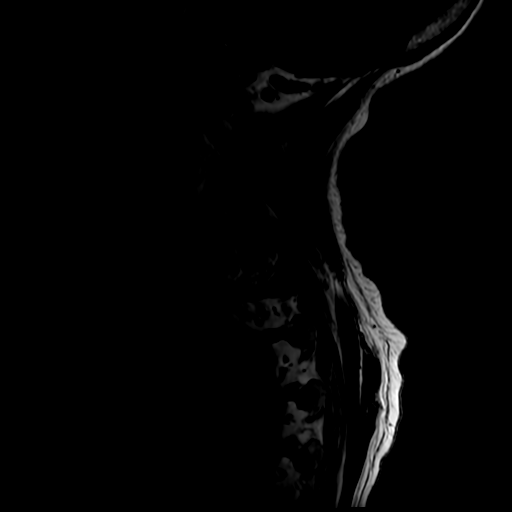

[Series 5: STIR · sagittal · 3.0mm · 0.39mm/px · 3 of 19 slices shown]
[im 1/19]
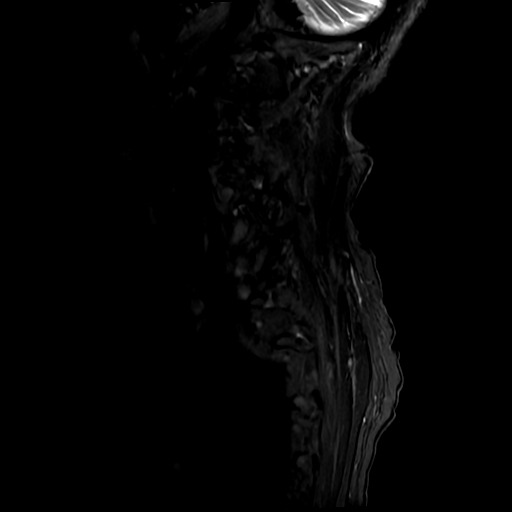
[im 10/19]
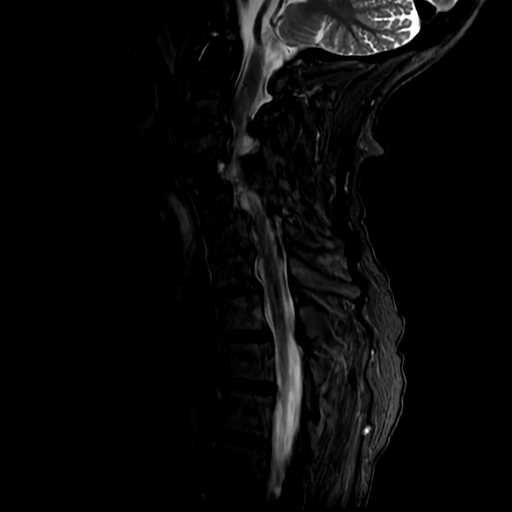
[im 19/19]
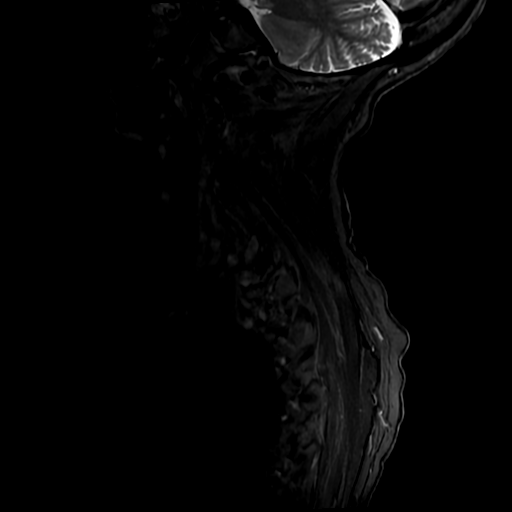

[Series 7: T2 · oblique · 3.0mm · 0.35mm/px · 7 of 31 slices shown (2 of 2)]
[im 1/31]
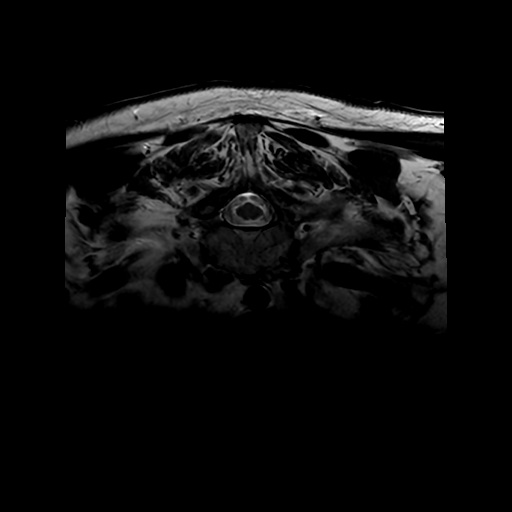
[im 5/31]
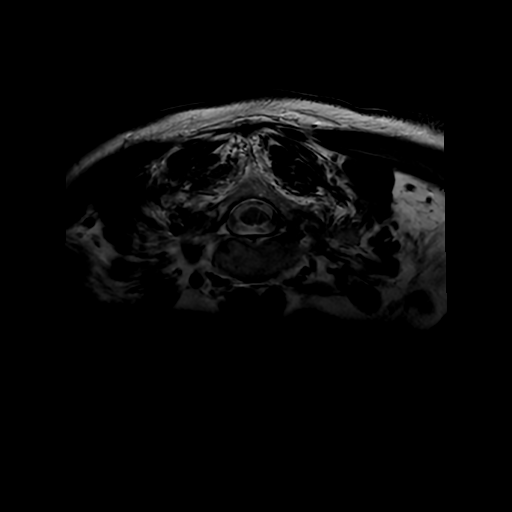
[im 9/31]
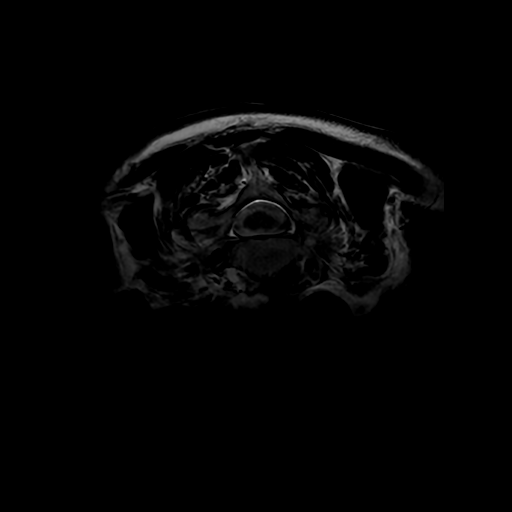
[im 13/31]
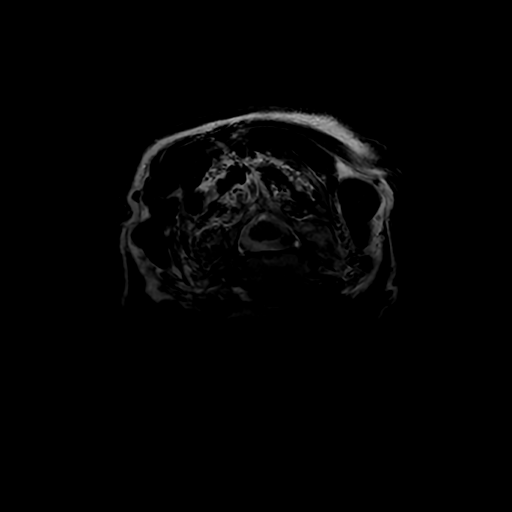
[im 18/31]
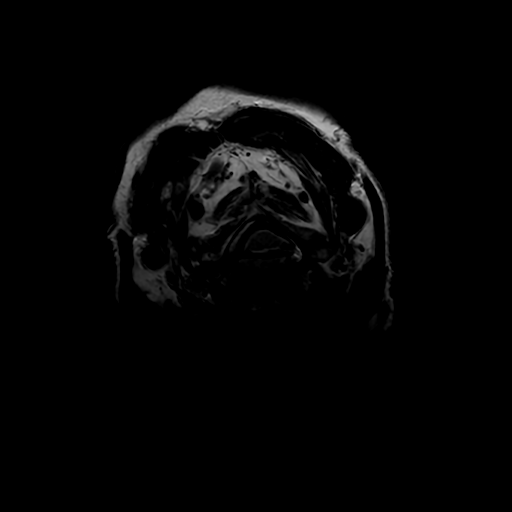
[im 22/31]
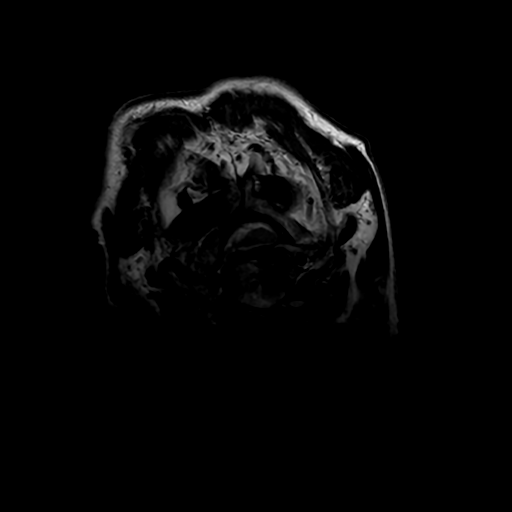
[im 26/31]
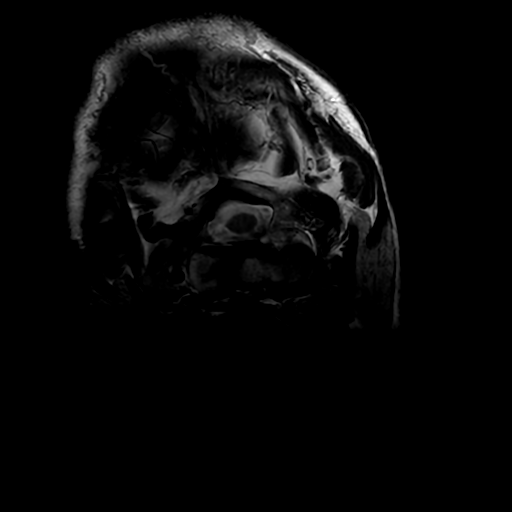

[17 of 48 positions shown; findings below may reference images not displayed]

FINDINGS: Alignment: Straightening and reversal of the normal cervical
lordosis. Underlying dextroscoliosis. 3 mm anterolisthesis of C2 on
C3, with 3 mm retrolisthesis of C3 on C4. 4 mm anterolisthesis of C4
on C5, with 3 mm anterolisthesis of C5 on C6 3 mm anterolisthesis of
C7 on T1 and T1 on T2. Findings chronic and degenerative.

Vertebrae: Vertebral body height maintained without acute or chronic
fracture. Bone marrow signal intensity normal. No discrete or
worrisome osseous lesions. No abnormal marrow edema.

Cord: Normal signal and morphology.

Posterior Fossa, vertebral arteries, paraspinal tissues: Visualized
brain and posterior fossa within normal limits. Craniocervical
junction normal. Paraspinous soft tissues within normal limits.
Normal flow voids seen within the vertebral arteries bilaterally.
Tortuosity of both carotid artery systems with medialization into
the retropharyngeal space noted.

Disc levels:

C2-C3: 3 mm anterolisthesis. Disc bulge with endplate and
uncovertebral spurring. Moderate bilateral facet hypertrophy.
Resultant moderate spinal stenosis with moderate bilateral C3
foraminal narrowing.

C3-C4: Retrolisthesis. Degenerative intervertebral disc space
narrowing with diffuse disc osteophyte, asymmetric to the left.
Broad posterior component flattens and partially effaces the ventral
thecal sac. Left greater than right facet hypertrophy. Mild to
moderate spinal stenosis. Severe left with moderate right C4
foraminal stenosis.

C4-C5: Anterolisthesis. Mild uncovertebral spurring without
significant disc bulge. Moderate facet hypertrophy. No significant
spinal stenosis. Mild right C5 foraminal narrowing. Left neural
foramen remains patent.

C5-C6: Anterolisthesis with intervertebral disc space narrowing.
Associated disc bulge with endplate and uncovertebral spurring. Left
greater than right facet arthrosis. No significant spinal stenosis.
Moderate left C6 foraminal narrowing. Right neural foramina remains
patent.

C6-C7: Advanced intervertebral disc space narrowing with partial
ankylosis. Associated endplate and uncovertebral spurring. Bilateral
facet arthrosis. No significant spinal stenosis. Mild right C7
foraminal narrowing. Left neural foramen is patent.

C7-T1: Anterolisthesis. Mild disc bulge with uncovertebral
hypertrophy. Moderate bilateral facet arthrosis. No significant
spinal stenosis. Foramina remain patent.

T1-2: Mild disc bulge with moderate facet hypertrophy. No
significant stenosis.
IMPRESSION: 1. Moderately advanced multilevel cervical spondylosis with
resultant mild to moderate spinal stenosis at C2-3 and C3-4. No
frank cord impingement.
2. Multifactorial degenerative changes with resultant multilevel
foraminal narrowing as above. Notable findings include moderate
bilateral C3 foraminal stenosis, severe left with moderate right C4
foraminal narrowing, with moderate left C6 foraminal stenosis.

## 2021-09-01 MED ORDER — CARVEDILOL 6.25 MG PO TABS
6.2500 mg | ORAL_TABLET | Freq: Two times a day (BID) | ORAL | Status: DC
  Administered 2021-09-01: 6.25 mg via ORAL
  Filled 2021-09-01: qty 1

## 2021-09-01 MED ORDER — CARVEDILOL 6.25 MG PO TABS: 6.2500 mg | ORAL_TABLET | Freq: Two times a day (BID) | ORAL | 1 refills | Status: AC

## 2021-09-01 MED ORDER — APIXABAN 2.5 MG PO TABS: 2.5000 mg | ORAL_TABLET | Freq: Two times a day (BID) | ORAL | 1 refills | Status: AC

## 2021-09-01 NOTE — Progress Notes (Signed)
24 hour urine started.

## 2021-09-01 NOTE — Progress Notes (Signed)
? ?Progress Note ? ?Patient Name: Jordan Nicholson ?Date of Encounter: 09/01/2021 ? ?Primary Cardiologist: None  ? ? ? ?Patient Profile  ? ?Jordan Nicholson is a 88 y.o. female with a history of paroxysmal atrial fibrillation on Eliquis, chronic lower extremity edema, prior DVT, stroke, GERD, fibromyalgia, cervical myelopathy, and migraines seen 08/31/2021 for the evaluation of atrial fibrillation and hypertensive emergency in the sense of dizziness which is better described as a sense of falling at the request of Dr. Thompson Grayer. ?4/23 EF 55-60% AS mild ? ?Subjective  ? ?Feels much better.  ?Would like to walk ? ?Inpatient Medications  ?  ?Scheduled Meds: ? apixaban  2.5 mg Oral BID  ? carvedilol  6.25 mg Oral BID WC  ? famotidine  20 mg Oral Daily  ? lansoprazole  60 mg Oral Q1200  ? sodium chloride flush  3 mL Intravenous Once  ? ?Continuous Infusions: ? nitroGLYCERIN Stopped (08/31/21 2104)  ? ?PRN Meds: ?acetaminophen  ? ?Vital Signs  ?  ?Vitals:  ? 08/31/21 1930 08/31/21 2348 09/01/21 0000 09/01/21 0436  ?BP: (!) 116/50 (!) 162/60 (!) 150/63 (!) 158/69  ?Pulse: 75 75 73 76  ?Resp: 16 20 (!) 25 14  ?Temp: 98.2 ?F (36.8 ?C) 97.8 ?F (36.6 ?C)  98.2 ?F (36.8 ?C)  ?TempSrc: Oral Oral  Oral  ?SpO2: 99%     ?Weight:      ?Height:      ? ? ?Intake/Output Summary (Last 24 hours) at 09/01/2021 0915 ?Last data filed at 09/01/2021 0911 ?Gross per 24 hour  ?Intake 48.96 ml  ?Output 200 ml  ?Net -151.04 ml  ? ?Filed Weights  ? 08/29/21 1700  ?Weight: 45.8 kg  ? ? ?Telemetry  ?  ?Sinus with frequent atrial ectopy- Personally Reviewed ? ?ECG  ?  ?  ? ?Physical Exam  ?  ?GEN: No acute distress    ?Neck: No JVD ?Cardiac: Irregular rate and rhythm, 2-3/6 murmur .  ?Respiratory: Clear to auscultation bilaterally. ?GI: Soft, nontender, non-distended  ?MS: No edema; No deformity. ?Neuro:  Nonfocal  ?Psych: Normal affect  ? ?Labs  ?  ?Chemistry ?Recent Labs  ?Lab 08/29/21 ?1721 08/31/21 ?0051 09/01/21 ?0335  ?NA 139 141 139  ?K 4.0 4.7 4.2  ?CL 105 110  107  ?CO2 24 24 27   ?GLUCOSE 89 136* 81  ?BUN 22 27* 19  ?CREATININE 0.88 1.23* 1.02*  ?CALCIUM 9.7 8.7* 8.8*  ?PROT 7.0  --   --   ?ALBUMIN 4.0  --   --   ?AST 21  --   --   ?ALT 12  --   --   ?ALKPHOS 79  --   --   ?BILITOT 0.5  --   --   ?GFRNONAA >60 41* 52*  ?ANIONGAP 10 7 5   ?  ? ?Hematology ?Recent Labs  ?Lab 08/29/21 ?1721 08/31/21 ?0051  ?WBC 8.5 8.1  ?RBC 3.16* 2.78*  ?HGB 11.7* 10.5*  ?HCT 32.2* 28.8*  ?MCV 101.9* 103.6*  ?MCH 37.0* 37.8*  ?MCHC 36.3* 36.5*  ?RDW 16.0* 17.0*  ?PLT 256 231  ? ? ?Cardiac EnzymesNo results for input(s): TROPONINI in the last 168 hours. No results for input(s): TROPIPOC in the last 168 hours.  ? ?BNPNo results for input(s): BNP, PROBNP in the last 168 hours.  ? ?DDimer No results for input(s): DDIMER in the last 168 hours.  ? ?Radiology  ?  ?ECHOCARDIOGRAM COMPLETE ? ?Result Date: 08/30/2021 ?   ECHOCARDIOGRAM REPORT   Patient Name:  Jordan Nicholson Date of Exam: 08/30/2021 Medical Rec #:  [id number]  Height:       61.5 in Accession #:    [id number] Weight:       101.0 lb Date of Birth:  1926/06/17  BSA:          1.421 m? Patient Age:    46 years   BP:           148/69 mmHg Patient Gender: F          HR:           77 bpm. Exam Location:  Inpatient Procedure: 2D Echo Indications:    Syncope  History:        Patient has no prior history of Echocardiogram examinations.  Sonographer:    Jordan Nicholson Referring Phys: [id number] [place]  1. Left ventricular ejection fraction, by estimation, is 55 to 60%. The left ventricle has normal function. The left ventricle has no regional wall motion abnormalities. There is mild left ventricular hypertrophy. Left ventricular diastolic parameters are consistent with Grade I diastolic dysfunction (impaired relaxation).  2. Right ventricular systolic function is normal. The right ventricular size is normal.  3. The mitral valve is grossly normal. Trivial mitral valve regurgitation. Moderate mitral annular calcification.  4. The aortic  valve is tricuspid. There is moderate calcification of the aortic valve. There is moderate thickening of the aortic valve. Aortic valve regurgitation is mild. Mild aortic valve stenosis. FINDINGS  Left Ventricle: Left ventricular ejection fraction, by estimation, is 55 to 60%. The left ventricle has normal function. The left ventricle has no regional wall motion abnormalities. The left ventricular internal cavity size was normal in size. There is  mild left ventricular hypertrophy. Left ventricular diastolic parameters are consistent with Grade I diastolic dysfunction (impaired relaxation). Right Ventricle: The right ventricular size is normal. Right vetricular wall thickness was not well visualized. Right ventricular systolic function is normal. Left Atrium: Left atrial size was normal in size. Right Atrium: Right atrial size was normal in size. Pericardium: There is no evidence of pericardial effusion. Mitral Valve: The mitral valve is grossly normal. Moderate mitral annular calcification. Trivial mitral valve regurgitation. Tricuspid Valve: The tricuspid valve is normal in structure. Tricuspid valve regurgitation is not demonstrated. Aortic Valve: The aortic valve is tricuspid. There is moderate calcification of the aortic valve. There is moderate thickening of the aortic valve. Aortic valve regurgitation is mild. Aortic regurgitation PHT measures 680 msec. Mild aortic stenosis is present. Aortic valve mean gradient measures 12.7 mmHg. Aortic valve peak gradient measures 25.3 mmHg. Aortic valve area, by VTI measures 1.42 cm?. Pulmonic Valve: The pulmonic valve was normal in structure. Pulmonic valve regurgitation is not visualized. Aorta: The aortic root and ascending aorta are structurally normal, with no evidence of dilitation. IAS/Shunts: The interatrial septum was not well visualized.  LEFT VENTRICLE PLAX 2D LVIDd:         3.70 cm   Diastology LVIDs:         2.60 cm   LV e' lateral:   9.41 cm/s LV PW:          1.10 cm   LV E/e' lateral: 10.6 LV IVS:        1.10 cm LVOT diam:     2.00 cm LV SV:         75 LV SV Index:   53 LVOT Area:     3.14 cm?  RIGHT VENTRICLE  IVC TAPSE (M-mode): 1.9 cm  IVC diam: 1.40 cm LEFT ATRIUM             Index        RIGHT ATRIUM           Index LA diam:        3.10 cm 2.18 cm/m?   RA Area:     12.40 cm? LA Vol (A2C):   32.0 ml 22.52 ml/m?  RA Volume:   28.40 ml  19.98 ml/m? LA Vol (A4C):   58.3 ml 41.02 ml/m? LA Biplane Vol: 45.7 ml 32.16 ml/m?  AORTIC VALVE                     PULMONIC VALVE AV Area (Vmax):    1.34 cm?      PV Vmax:       0.70 m/s AV Area (Vmean):   1.19 cm?      PV Peak grad:  2.0 mmHg AV Area (VTI):     1.42 cm? AV Vmax:           251.67 cm/s AV Vmean:          162.000 cm/s AV VTI:            0.526 m AV Peak Grad:      25.3 mmHg AV Mean Grad:      12.7 mmHg LVOT Vmax:         107.00 cm/s LVOT Vmean:        61.500 cm/s LVOT VTI:          0.238 m LVOT/AV VTI ratio: 0.45 AI PHT:            680 msec  AORTA Ao Asc diam: 3.60 cm MITRAL VALVE MV Area (PHT): 2.59 cm?     SHUNTS MV Decel Time: 293 msec     Systemic VTI:  0.24 m MV E velocity: 100.00 cm/s  Systemic Diam: 2.00 cm MV A velocity: 141.00 cm/s MV E/A ratio:  0.71 Mertie Moores MD Electronically signed by Mertie Moores MD Signature Date/Time: 08/30/2021/3:28:20 PM    Final    ? ?Cardiac Studies  ?  ?Assessment & Plan  ?  ?Sensation of falling ?  ?-Orthostatic lightheadedness ?  ?Hypertensive emergency ?  ?Anemia-macrocytic ?  ?Atrial fibrillation-paroxysmal by history ?  ?Stroke ?  ?Blood pressure is much improved.  Ongoing up titration of her oral medications per primary service ? ?Macrocytic anemia without folate or B12 deficiency. ? ?Ongoing evaluation of hypertensive crisis per primary service. ? ?We will sign off call if we can be of further assistance ? ? ? ?For questions or updates, please contact [facility] ?Please consult [email or web address] for contact info under Cardiology/STEMI. ?  ?   ?Signed, ?Virl Axe, MD  ?09/01/2021, 9:15 AM   ? ?

## 2021-09-01 NOTE — Progress Notes (Signed)
1 PIV removed without complication. Discharge instructions given to patient she understands discharge orders and new medication. Son in [place] will be here for pick up.  ?

## 2021-09-01 NOTE — Progress Notes (Signed)
FPTS Interim Progress Note ? ? ?2000: Paged by nursing about an episode of dizziness that the patient experienced after MRI.  Went to bedside to evaluate the patient.  She reports of an episode of short-lived dizziness that has since dissipated, had not had any other episodes of dizziness while in the hospital.  No loss of consciousness, chest pain, shortness of breath.  Patient is hemodynamically stable, sitting up in bed without any distress, and is currently asymptomatic.  Patient reports that she believes the onset of dizziness could be secondary to pain from her fracture in the left arm.  We discussed the option of discharging today or observing another night.  Patient was adamant that she would be able to go to her independent living facility tonight and felt very comfortable doing so.  She reports that at her facility, there is nursing available overnight, and that she has a lot of assistance when needed.  I provided strict return precautions and instructed the patient that if she feels the onset of dizziness again prior to leaving the hospital, she should let us know immediately.  Overall, she is very stable, asymptomatic, vital signs wnl, and I believe stable for discharge to her independent living facility.  Called patient's daughter Suella Grove and son-in-law Shanon Brow to discuss the discharge plan, and Shanon Brow will be on his way to pick Ms. Tidmore up from the hospital and safely transport her back to her home.  ? ?Erskine Emery, MD ?09/01/2021, 8:16 PM ?PGY-1, [place] ?Service pager [phone] ? ?

## 2021-09-01 NOTE — Progress Notes (Signed)
Mobility Specialist Progress Note: ? ? 09/01/21 1000  ?Mobility  ?Activity Ambulated with assistance in hallway  ?Level of Assistance Minimal assist, patient does 75% or more  ?Assistive Device Other (Comment) ?(HHA)  ?Distance Ambulated (ft) 500 ft  ?Activity Response Tolerated well  ?$Mobility charge 1 Mobility  ? ?Pt received eager for mobility session. Required HHA (R) for comfort d/t NWB LUE. Pt with mild unsteadiness, otherwise asx. Left in bed with all needs met.  ? ?Nelta Numbers ?Acute Rehab ?Phone: [phone] ?Office Phone: [phone] ? ?

## 2021-09-01 NOTE — Discharge Summary (Addendum)
Family Medicine Teaching Service ?Hospital Discharge Summary ? ?Patient name: Jordan Nicholson Medical record number: [id number] ?Date of birth: 04-10-1927 Age: 88 y.o. Gender: female ?Date of Admission: 08/29/2021  Date of Discharge: 09/01/2021 ?Admitting Physician: Jordan Chime, MD ? ?Primary Care Provider: Janie Morning, DO ?Consultants: Cardiology ? ?Indication for Hospitalization: Near-syncope, hypertensive emergencey ? ?Discharge Diagnoses/Problem List:  ?Principal Problem: ?  Hypertensive emergency ?Active Problems: ?  Leg swelling ?  Chronic venous insufficiency ?  AKI (acute kidney injury) ([place]) ?  GERD (gastroesophageal reflux disease) ? ?Disposition: [facility]  ? ?Discharge Condition: Stable ? ?Discharge Exam:  ?Blood pressure (!) 164/61, pulse 75, temperature 98.3 ?F (36.8 ?C), temperature source Oral, resp. rate 19, height 5' 1.5" (1.562 m), weight 45.8 kg, SpO2 99 %. ?Physical Exam: ?General: NAD, elderly female, well-appearing, sitting up in bed ?Cardiovascular: Irregular rate, on cardiac telemetry, blowing systolic murmur ?Respiratory: Breathing comfortably on room air, clear to auscultation bilaterally ?Abdomen: Mild discomfort with palpation, nondistended ?Extremities: Moves all extremities equally and appropriately, bilateral chronic venous stasis changes, tender to palpation when assessing for edema ? ?Brief Hospital Course:  ?Jordan Nicholson is a 88 y.o.female with a history of TIA, stroke, Afib, GERD, LE venous insufficiency, esophageal surgery who was admitted to the [place] [facility] for near syncope and hypertensive emergency. Her hospital course is detailed below: ? ?Near Syncope 2/2 Hypertensive Emergency  ?Patient presented to the emergency room with an episode of near syncope and weakness.  Blood pressure 207/85, troponins 31 and 28, EKG with nonspecific changes.  CT of the head showed no acute changes with remote infarcts. Echo eith EF 50-60%, mild LVH and grade  1 diastolic dysfunction.  Cardiology was consulted and started the patient on a nitro drip.  Blood pressures improved with nitro drip (discontinued on day 2) and patient was started on coreg without adverse effects. Discharged on 6.25 mg Coreg BID. She did not have any recurrent episodes of lightheadedness or dizziness. Worked with PT/OT during hospitalization and felt safe to return to ILF with continued therapy there. Blood pressures at time of discharge were ranging 150s/60s.  ? ?A-fib  ?Rate controlled throughout hospitalization.  Continued Eliquis 2.5 Mg twice daily given age and weight. Discharged with coreg as well for rate control and for her BP as noted above.  ? ?Non-Oliguric Pre-renal AKI ?Creatinine increased from 0.88>1.23 overnight. Thought to be due to hypertensive emergency vs possible dehydration. This improved by the following day with encouraged PO intake. Creatinine 1.02 at discharge.  ? ?Cervical Stenosis  Cervical spondylosis  ?Patient reported of a history of cervical spine complications, that she has been seen for in the past. While inpatient, we ordered a c-spine MRI to ensure no cord impingement. Cervical MRI showed moderately advanced multilevel cervical spondylosis with spinal stenosis at C2-3 and C3-4 multiple degenerative changes with bilateral C3 foraminal stenosis, severe left with moderate right C4 foraminal narrowing, moderate left C6 foraminal stenosis, without any frank cord impingement.  Findings likely in the setting of advanced age.  Reassuringly, no evidence of cord impingement.  Follow-up outpatient. ? ?Other chronic conditions were medically managed with home medications and formulary alternatives as necessary (chronic venous stasis, GERD) ? ?PCP Follow-up Recommendations: ?Blood pressure check; adjust medications as needed  ?Changed Eliquis dosing to 2.5 mg BID given her age and weight ?Macrocytic anemia. Folate normal and B12 elevated during hospitalization, monitor and  consider shared decision making with patient to further work-up given her age.  ?  Aldosterone and renin pending at time of discharge ?F/u cervical MRI results outpt  ? ?Significant Procedures: None ? ?Significant Labs and Imaging:  ?Recent Labs  ?Lab 08/29/21 ?1721 08/31/21 ?0051  ?WBC 8.5 8.1  ?HGB 11.7* 10.5*  ?HCT 32.2* 28.8*  ?PLT 256 231  ? ?Recent Labs  ?Lab 08/29/21 ?1721 08/31/21 ?0051 09/01/21 ?0335  ?NA 139 141 139  ?K 4.0 4.7 4.2  ?CL 105 110 107  ?CO2 24 24 27   ?GLUCOSE 89 136* 81  ?BUN 22 27* 19  ?CREATININE 0.88 1.23* 1.02*  ?CALCIUM 9.7 8.7* 8.8*  ?ALKPHOS 79  --   --   ?AST 21  --   --   ?ALT 12  --   --   ?ALBUMIN 4.0  --   --   ? ?CT Head 4/13 ?IMPRESSION: ?1. No evidence of acute intracranial abnormality. ?2. Similar remote infarcts and chronic microvascular ischemic ?disease. ?3.  Cerebral atrophy (ICD10-G31.9). ? ?Echo 4/14:  ?IMPRESSIONS  ? 1. Left ventricular ejection fraction, by estimation, is 55 to 60%. The  ?left ventricle has normal function. The left ventricle has no regional  ?wall motion abnormalities. There is mild left ventricular hypertrophy.  ?Left ventricular diastolic parameters  ?are consistent with Grade I diastolic dysfunction (impaired relaxation).  ? 2. Right ventricular systolic function is normal. The right ventricular  ?size is normal.  ? 3. The mitral valve is grossly normal. Trivial mitral valve  ?regurgitation. Moderate mitral annular calcification.  ? 4. The aortic valve is tricuspid. There is moderate calcification of the  ?aortic valve. There is moderate thickening of the aortic valve. Aortic  ?valve regurgitation is mild. Mild aortic valve stenosis.  ? ?MR CERVICAL SPINE WO CONTRAST ? ?Result Date: 09/01/2021 ?CLINICAL DATA:  Initial evaluation for cervical radiculopathy. EXAM: MRI CERVICAL SPINE WITHOUT CONTRAST TECHNIQUE: Multiplanar, multisequence MR imaging of the cervical spine was performed. No intravenous contrast was administered. COMPARISON:  CT from  08/03/2021. FINDINGS: Alignment: Straightening and reversal of the normal cervical lordosis. Underlying dextroscoliosis. 3 mm anterolisthesis of C2 on C3, with 3 mm retrolisthesis of C3 on C4. 4 mm anterolisthesis of C4 on C5, with 3 mm anterolisthesis of C5 on C6 3 mm anterolisthesis of C7 on T1 and T1 on T2. Findings chronic and degenerative. Vertebrae: Vertebral body height maintained without acute or chronic fracture. Bone marrow signal intensity normal. No discrete or worrisome osseous lesions. No abnormal marrow edema. Cord: Normal signal and morphology. Posterior Fossa, vertebral arteries, paraspinal tissues: Visualized brain and posterior fossa within normal limits. Craniocervical junction normal. Paraspinous soft tissues within normal limits. Normal flow voids seen within the vertebral arteries bilaterally. Tortuosity of both carotid artery systems with medialization into the retropharyngeal space noted. Disc levels: C2-C3: 3 mm anterolisthesis. Disc bulge with endplate and uncovertebral spurring. Moderate bilateral facet hypertrophy. Resultant moderate spinal stenosis with moderate bilateral C3 foraminal narrowing. C3-C4: Retrolisthesis. Degenerative intervertebral disc space narrowing with diffuse disc osteophyte, asymmetric to the left. Broad posterior component flattens and partially effaces the ventral thecal sac. Left greater than right facet hypertrophy. Mild to moderate spinal stenosis. Severe left with moderate right C4 foraminal stenosis. C4-C5: Anterolisthesis. Mild uncovertebral spurring without significant disc bulge. Moderate facet hypertrophy. No significant spinal stenosis. Mild right C5 foraminal narrowing. Left neural foramen remains patent. C5-C6: Anterolisthesis with intervertebral disc space narrowing. Associated disc bulge with endplate and uncovertebral spurring. Left greater than right facet arthrosis. No significant spinal stenosis. Moderate left C6 foraminal narrowing. Right neural  foramina remains  patent. C6-C7: Advanced intervertebral disc space narrowing with partial ankylosis. Associated endplate and uncovertebral spurring. Bilateral facet arthrosis. No significant spinal stenosis.

## 2021-09-01 NOTE — Progress Notes (Signed)
Family Medicine Teaching Service ?Daily Progress Note ?Intern Pager: [phone] ? ?Patient name: Jordan Nicholson Medical record number: [id number] ?Date of birth: Aug 06, 1926 Age: 88 y.o. Gender: female ? ?Primary Care Provider: Irena Reichmann, DO ?Consultants: Cardiology ?Code Status: FULL CODE ? ?Pt Overview and Major Events to Date:  ?4/14: Admitted  ? ?Assessment and Plan: ?Jordan Nicholson is a 88 y.o. female who presented to the emergency department after near syncopal episode and weakness, admitted for hypertensive emergency. PMHx is significant for TIA, CVA, GERD, Atrial fibrillation, LE venous insufficiency, esophageal surgery.  ? ?Hypertensive Emergency: Resolved ?Hypertension ?Nitroglycerin drip stopped. Coreg increased. BP ranging 150s/60s, HR 70s.  ?-Start coreg 6.25 mg BID  ?-Stable for d/c  ? ?Non-oliguric AKI: Improved  ?Cr 1.23>1.02. States she has been hydrating well without issues.  ?-Encourage PO intake; hold off on IV fluids  ? ?Atrial Fibrillation  ?Rate controlled, HR 70s. ?-Eliquis 5 mg BID, coreg 6.25 mg BID  ? ?Macrocytic Anemia ?Hgb 10.5, MCV 103.6. Iron panel within normal limits. Folate WNL, Vitamin B12 increased. TSH normal.  ? ?GERD: chronic, stable ?-Continue Lansoprazole, Pepcid  ? ?Weakness, Deconditioning ?Multifactorial and certainly age-related. ?-PT/OT working with patient  ? ?FEN/GI: Regular ?PPx: Eliquis at reduced dose  ?Dispo:Back to ILF today vs tomorrow. Barriers include BP stabilization and d/c nitro drip.  ? ?Subjective:  ?Jordan Nicholson feels well this morning. Has not had any recurrence of lightheadedness or dizziness. She feels back to her normal self. Appetite has been good and she is hydrating well. She has been able to get up and walk with assistance without issues. Hopeful for d/c back to her independent living facility shortly.  ? ?Objective: ?Temp:  [97.8 ?F (36.6 ?C)-98.2 ?F (36.8 ?C)] 98.2 ?F (36.8 ?C) (04/16 0436) ?Pulse Rate:  [73-82] 76 (04/16 0436) ?Resp:  [14-25] 14 (04/16  0436) ?BP: (116-162)/(50-90) 158/69 (04/16 0436) ?SpO2:  [93 %-99 %] 99 % (04/15 1930) ?Physical Exam: ?General: Elderly caucasian female in NAD, sitting up in recliner ?Cardiovascular: Irregularly irregular, HR 70s, holosystolic murmur ?Respiratory: CTAB without wheezing/rhonchi/rales ?Abdomen: Non-distended, non-tender in all quadrants ?Extremities: Chronic venous stasis, tender to palpation b/l  ? ?Laboratory: ?Recent Labs  ?Lab 08/29/21 ?1721 08/31/21 ?0051  ?WBC 8.5 8.1  ?HGB 11.7* 10.5*  ?HCT 32.2* 28.8*  ?PLT 256 231  ? ?Recent Labs  ?Lab 08/29/21 ?1721 08/31/21 ?0051 09/01/21 ?0335  ?NA 139 141 139  ?K 4.0 4.7 4.2  ?CL 105 110 107  ?CO2 24 24 27   ?BUN 22 27* 19  ?CREATININE 0.88 1.23* 1.02*  ?CALCIUM 9.7 8.7* 8.8*  ?PROT 7.0  --   --   ?BILITOT 0.5  --   --   ?ALKPHOS 79  --   --   ?ALT 12  --   --   ?AST 21  --   --   ?GLUCOSE 89 136* 81  ? ? ?Imaging/Diagnostic Tests: ?No results found. ? ? ? , DO ?09/01/2021, 6:58 AM ?PGY-2, [facility] ?FPTS Intern pager: [phone], text pages welcome ? ?

## 2021-09-01 NOTE — Progress Notes (Signed)
FPTS Brief Progress Note ? ?S Saw patient at bedside this evening. Patient was sleeping comfortably. I did not wake the patient. No concerns from RN.  ? ?O: ?BP (!) 150/63 (BP Location: Right Arm)   Pulse 73   Temp 97.8 ?F (36.6 ?C) (Oral)   Resp (!) 25   Ht 5' 1.5" (1.562 m)   Wt 45.8 kg   SpO2 99%   BMI 18.77 kg/m?   ? ?General: sleeping no acute distress ?Cardio: well perfused  ?Pulm: normal work of breathing ?Neuro: Cranial nerves grossly intact  ? ?A/P: ?Plan per day team  ?- Orders reviewed. Labs for AM ordered, which was adjusted as needed.  ? ?Lattie Haw, MD ?09/01/2021, 1:08 AM ?PGY-3, [facility] Night Resident  ?Please page [phone] with questions.   ?

## 2021-09-06 LAB — ALDOSTERONE + RENIN ACTIVITY W/ RATIO
ALDO / PRA Ratio: 16.7 (ref 0.0–30.0)
Aldosterone: 4 ng/dL (ref 0.0–30.0)
PRA LC/MS/MS: 0.24 ng/mL/hr (ref 0.167–5.380)

## 2021-09-24 ENCOUNTER — Other Ambulatory Visit: Payer: Self-pay | Admitting: Family Medicine

## 2021-10-03 ENCOUNTER — Telehealth: Payer: Self-pay

## 2021-10-03 NOTE — Telephone Encounter (Signed)
-----   Message from Duke Salvia, MD sent at 10/01/2021  4:35 AM EDT ----- Please Inform Patient that labs are normal  Thanks

## 2021-10-03 NOTE — Telephone Encounter (Signed)
Spoke with pt and advised per Dr Caryl Comes labs are normal.  Pt requests a hard copy of the lab.  Pt verbalizes understanding and thanked Therapist, sports for the call.  Lab results mailed to pt's address on file in [place].
